# Patient Record
Sex: Female | Born: 2000 | Race: Black or African American | Hispanic: No | Marital: Single | Smoking: Never smoker
Health system: Southern US, Community
[De-identification: ages and names within clinical notes are randomized; demographics above are authoritative.]

## PROBLEM LIST (undated history)

## (undated) DIAGNOSIS — J45909 Unspecified asthma, uncomplicated: Secondary | ICD-10-CM

## (undated) DIAGNOSIS — E785 Hyperlipidemia, unspecified: Secondary | ICD-10-CM

## (undated) DIAGNOSIS — M419 Scoliosis, unspecified: Secondary | ICD-10-CM

## (undated) DIAGNOSIS — E119 Type 2 diabetes mellitus without complications: Secondary | ICD-10-CM

## (undated) DIAGNOSIS — I10 Essential (primary) hypertension: Secondary | ICD-10-CM

## (undated) HISTORY — DX: Scoliosis, unspecified: M41.9

## (undated) HISTORY — DX: Unspecified asthma, uncomplicated: J45.909

## (undated) HISTORY — DX: Hyperlipidemia, unspecified: E78.5

## (undated) HISTORY — DX: Type 2 diabetes mellitus without complications: E11.9

## (undated) HISTORY — DX: Essential (primary) hypertension: I10

---
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
## 2020-01-22 ENCOUNTER — Encounter: Payer: Medicaid Other | Admitting: Certified Nurse Midwife

## 2020-01-24 ENCOUNTER — Telehealth: Payer: Self-pay

## 2020-01-24 NOTE — Telephone Encounter (Signed)
Sent pt no show letter

## 2020-02-24 ENCOUNTER — Ambulatory Visit: Payer: Medicaid Other

## 2020-03-31 DIAGNOSIS — Z0289 Encounter for other administrative examinations: Secondary | ICD-10-CM

## 2020-06-08 ENCOUNTER — Ambulatory Visit: Payer: Medicaid Other

## 2020-06-30 ENCOUNTER — Ambulatory Visit (LOCAL_COMMUNITY_HEALTH_CENTER): Payer: Medicaid Other | Admitting: Advanced Practice Midwife

## 2020-06-30 ENCOUNTER — Encounter: Payer: Self-pay | Admitting: Advanced Practice Midwife

## 2020-06-30 ENCOUNTER — Other Ambulatory Visit: Payer: Self-pay

## 2020-06-30 VITALS — BP 129/79 | Ht 61.0 in

## 2020-06-30 DIAGNOSIS — I159 Secondary hypertension, unspecified: Secondary | ICD-10-CM

## 2020-06-30 DIAGNOSIS — Z3202 Encounter for pregnancy test, result negative: Secondary | ICD-10-CM | POA: Diagnosis not present

## 2020-06-30 DIAGNOSIS — E1165 Type 2 diabetes mellitus with hyperglycemia: Secondary | ICD-10-CM | POA: Insufficient documentation

## 2020-06-30 DIAGNOSIS — E119 Type 2 diabetes mellitus without complications: Secondary | ICD-10-CM | POA: Insufficient documentation

## 2020-06-30 DIAGNOSIS — F419 Anxiety disorder, unspecified: Secondary | ICD-10-CM

## 2020-06-30 DIAGNOSIS — Z3009 Encounter for other general counseling and advice on contraception: Secondary | ICD-10-CM

## 2020-06-30 DIAGNOSIS — Z7289 Other problems related to lifestyle: Secondary | ICD-10-CM | POA: Insufficient documentation

## 2020-06-30 DIAGNOSIS — E0869 Diabetes mellitus due to underlying condition with other specified complication: Secondary | ICD-10-CM

## 2020-06-30 DIAGNOSIS — Z30017 Encounter for initial prescription of implantable subdermal contraceptive: Secondary | ICD-10-CM | POA: Diagnosis not present

## 2020-06-30 DIAGNOSIS — F319 Bipolar disorder, unspecified: Secondary | ICD-10-CM | POA: Insufficient documentation

## 2020-06-30 DIAGNOSIS — I10 Essential (primary) hypertension: Secondary | ICD-10-CM | POA: Insufficient documentation

## 2020-06-30 HISTORY — DX: Other problems related to lifestyle: Z72.89

## 2020-06-30 LAB — WET PREP FOR TRICH, YEAST, CLUE
Trichomonas Exam: NEGATIVE
Yeast Exam: NEGATIVE

## 2020-06-30 LAB — PREGNANCY, URINE: Preg Test, Ur: NEGATIVE

## 2020-06-30 MED ORDER — ETONOGESTREL 68 MG ~~LOC~~ IMPL
68.0000 mg | DRUG_IMPLANT | Freq: Once | SUBCUTANEOUS | Status: AC
  Administered 2020-06-30: 68 mg via SUBCUTANEOUS

## 2020-06-30 NOTE — Progress Notes (Signed)
[facility] DEPARTMENT [facility] [address] Main Number: [phone]    Family Planning Visit- Initial Visit  Subjective:  Tina Day is a 20 y.o. SBF nonsmoker [id number]   being seen today for an initial annual visit and to discuss contraceptive options.  The patient is currently using Condoms for pregnancy prevention. Patient reports she does not want a pregnancy in the next year.  Patient has the following medical conditions has Seasonal allergies; Scoliosis; Asthma; Irregular menses; Obesity (BMI 30-39.9) 195 lbs; Psychophysiological insomnia; Diabetes (HCC); Self-mutilation age 52-2017; and Hypertension dx'd 2021--noncompliant with meds on their problem list.  No chief complaint on file.   Patient reports LMP 2021. Was on DMPA in 2021. PT states she lost her virginity on 05/23/20 with partner of 1-2 mo with condom; 1 partner in last 3 mo. Wants Nexplanon for birth control. Not working. 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 Living with her mom and brother. Not taking meds for HTN dx'd 2021.  Patient denies cigs, vaping, cigars, MJ, ETOH  There is no height or weight on file to calculate BMI. - Patient is eligible for diabetes screening based on BMI and age >40?  Pt has diabetes HA1C ordered? Followed by primary care MD  Patient reports 1  partner/s in last year. Desires STI screening?  Yes  Has patient been screened once for HCV in the past?  No  No results found for: HCVAB  Does the patient have current drug use (including MJ), have a partner with drug use, and/or has been incarcerated since last result? No  If yes-- Screen for HCV through [facility]   Does the patient meet criteria for HBV testing? No  Criteria:  -Household, sexual or needle sharing contact with HBV -History of drug use -HIV positive -Those with known Hep C   Health Maintenance Due  Topic Date Due   PNEUMOCOCCAL POLYSACCHARIDE VACCINE AGE  77-64 HIGH RISK  Never done   COVID-19 Vaccine (1) Never done   Pneumococcal Vaccine 74-7 Years old (1 - PCV) Never done   FOOT EXAM  Never done   OPHTHALMOLOGY EXAM  Never done   HPV VACCINES (1 - 2-dose series) Never done   HEMOGLOBIN A1C  12/05/2013   CHLAMYDIA SCREENING  Never done   HIV Screening  Never done   Hepatitis C Screening  Never done   TETANUS/TDAP  Never done   Zoster Vaccines- Shingrix (1 of 2) Never done    Review of Systems  All other systems reviewed and are negative.  The following portions of the patient's history were reviewed and updated as appropriate: allergies, current medications, past family history, past medical history, past social history, past surgical history and problem list. Problem list updated.   See flowsheet for other program required questions.  Objective:  There were no vitals filed for this visit.  Physical Exam Constitutional:      Appearance: Normal appearance. She is obese.  HENT:     Head: Normocephalic and atraumatic.     Mouth/Throat:     Mouth: Mucous membranes are moist.     Comments: Pt states last dental exam 05/2020 Eyes:     Conjunctiva/sclera: Conjunctivae normal.  Neck:     Thyroid: No thyroid mass, thyromegaly or thyroid tenderness.  Cardiovascular:     Rate and Rhythm: Normal rate and regular rhythm.  Pulmonary:     Effort: Pulmonary effort is normal.     Breath  sounds: Normal breath sounds.  Abdominal:     Palpations: Abdomen is soft.     Tenderness: There is no guarding.     Comments: Soft without masses or tenderness, poor tone  Genitourinary:    General: Normal vulva.     Exam position: Lithotomy position.     Vagina: Vaginal discharge (clear leukorrhea, ph<4.5) present.     Rectum: Normal.  Musculoskeletal:        General: Normal range of motion.     Cervical back: Normal range of motion and neck supple.  Skin:    General: Skin is warm and dry.  Neurological:     Mental Status: She is alert.   Psychiatric:        Mood and Affect: Mood normal.      Assessment and Plan:  Tina Colcord is a 20 y.o. female presenting to the [facility] Department for an initial annual wellness/contraceptive visit  Contraception counseling: Reviewed all forms of birth control options in the tiered based approach. available including abstinence; over the counter/barrier methods; hormonal contraceptive medication including pill, patch, ring, injection,contraceptive implant, ECP; hormonal and nonhormonal IUDs; permanent sterilization options including vasectomy and the various tubal sterilization modalities. Risks, benefits, and typical effectiveness rates were reviewed.  Questions were answered.  Written information was also given to the patient to review.  Patient desires nexplanon insertion, this was prescribed for patient. She will follow up in  prn for surveillance.  She was told to call with any further questions, or with any concerns about this method of contraception.  Emphasized use of condoms 100% of the time for STI prevention.  Patient was offered ECP.  ECP was not accepted by the patient. ECP counseling was not given - see RN documentation  1. Family planning Treat wet mount per standing orders Immunization nurse consult - WET PREP FOR TRICH, YEAST, CLUE - Pregnancy, urine - Chlamydia/Gonorrhea [facility]  2. Encounter for initial prescription of implantable subdermal contraceptive Nexplanon Insertion Procedure Patient identified, informed consent performed, consent signed.   Patient does understand that irregular bleeding is a very common side effect of this medication. She was advised to have backup contraception after placement. Patient was determined to meet WHO criteria for not being pregnant. Appropriate time out taken.  The insertion site was identified 8-10 cm (3-4 inches) from the medial epicondyle of the humerus and 3-5 cm (1.25-2 inches) posterior to (below) the sulcus  (groove) between the biceps and triceps muscles of the patient's left arm and marked.  Patient was prepped with alcohol swab and then injected with 3 ml of 1% lidocaine.  Arm was prepped with chlorhexidene, Nexplanon removed from packaging,  Device confirmed in needle, then inserted full length of needle and withdrawn per handbook instructions. Nexplanon was able to palpated in the patient's arm; patient palpated the insert herself. There was minimal blood loss.  Patient insertion site covered with guaze and a pressure bandage to reduce any bruising.  The patient tolerated the procedure well and was given post procedure instructions.   Nexplanon:   Counseled patient to take OTC analgesic starting as soon as lidocaine starts to wear off and take regularly for at least 48 hr to decrease discomfort.  Specifically to take with food or milk to decrease stomach upset and for IB 600 mg (3 tablets) every 6 hrs; IB 800 mg (4 tablets) every 8 hrs; or Aleve 2 tablets every 12 hrs.   3. Diabetes mellitus due to underlying condition  with other specified complication, unspecified whether long term insulin use (HCC)   4. Self-mutilation age 29-2017   5. Secondary hypertension Non compliant with medication     No follow-ups on file.  No future appointments.  Alberteen Spindle, CNM

## 2020-06-30 NOTE — Progress Notes (Signed)
Pt here for PE and Nexplanon insertion.  Contraindications reviewed and consent forms signed.  Nexplanon inserted without any complications.  Wet mount results reviewed, no medication required.  Pt informed to call with any questions or concerns. Berdie Ogren, RN

## 2020-06-30 NOTE — Progress Notes (Signed)
Please see other visit for notes on this same day

## 2020-09-18 DIAGNOSIS — D509 Iron deficiency anemia, unspecified: Secondary | ICD-10-CM | POA: Insufficient documentation

## 2020-09-18 NOTE — Progress Notes (Deleted)
Tina Day  Telephone:(336) [phone] Fax:(336) [phone]  ID: Tina Day OB: May 25, 2000  MR#: [id number]  JAS#:[id number]  Patient Care Team: Pcp, No as PCP - General  CHIEF COMPLAINT: Iron deficiency anemia.  INTERVAL HISTORY: ***  REVIEW OF SYSTEMS:   ROS  As per HPI. Otherwise, a complete review of systems is negative.  PAST MEDICAL HISTORY: Past Medical History:  Diagnosis Date   Asthma    Diabetes (HCC)    Hyperlipidemia    Hypertension    Scoliosis     PAST SURGICAL HISTORY: No past surgical history on file.  FAMILY HISTORY: Family History  Problem Relation Age of Onset   Seizures Paternal Uncle    Diabetes Maternal Grandmother    Stroke Maternal Grandmother    Diabetes Maternal Grandfather    Cancer Paternal Grandmother    Stroke Paternal Grandmother    Cancer Paternal Grandfather    Thyroid disease Mother    Lupus Mother     ADVANCED DIRECTIVES (Y/N):  N  HEALTH MAINTENANCE: Social History   Tobacco Use   Smoking status: Never   Smokeless tobacco: Never  Vaping Use   Vaping Use: Never used  Substance Use Topics   Alcohol use: Never   Drug use: Never     Colonoscopy:  PAP:  Bone density:  Lipid panel:  Allergies  Allergen Reactions   Penicillins Swelling    Current Outpatient Medications  Medication Sig Dispense Refill   Acetaminophen (TYLENOL PO) Take by mouth.     adapalene (DIFFERIN) 0.1 % cream Apply pea sized amount to entire face, start 2-3 times per week. Then increase as tolerated. DISPENSE BRAND PER MEDICAID     Cholecalciferol (VITAMIN D3) 1.25 MG (50000 UT) CAPS Take 1 capsule by mouth once a week.     empagliflozin (JARDIANCE) 10 MG TABS tablet Take by mouth daily.     losartan (COZAAR) 25 MG tablet Take 12.5 mg by mouth daily.     medroxyPROGESTERone (DEPO-PROVERA) 150 MG/ML injection Inject 1 mL (150 mg total) into the muscle every 3 (three) months. (Patient not taking: Reported on 06/30/2020) 1 mL 3    metFORMIN (GLUCOPHAGE) 500 MG tablet Take 500 mg by mouth 2 (two) times daily. (Patient not taking: Reported on 06/30/2020)     pravastatin (PRAVACHOL) 10 MG tablet SMARTSIG:1 Tablet(s) By Mouth Every Evening     spironolactone (ALDACTONE) 100 MG tablet Take by mouth.     No current facility-administered medications for this visit.    OBJECTIVE: There were no vitals filed for this visit.   There is no height or weight on file to calculate BMI.    ECOG FS:{CHL ONC [id number]  General: Well-developed, well-nourished, no acute distress. Eyes: Pink conjunctiva, anicteric sclera. HEENT: Normocephalic, moist mucous membranes. Lungs: No audible wheezing or coughing. Heart: Regular rate and rhythm. Abdomen: Soft, nontender, no obvious distention. Musculoskeletal: No edema, cyanosis, or clubbing. Neuro: Alert, answering all questions appropriately. Cranial nerves grossly intact. Skin: No rashes or petechiae noted. Psych: Normal affect. Lymphatics: No cervical, calvicular, axillary or inguinal LAD.   LAB RESULTS:  Lab Results  Component Value Date   NA 137 06/04/2013   K 3.8 06/04/2013   CL 103 06/04/2013   CO2 31 (H) 06/04/2013   GLUCOSE 86 06/04/2013   BUN 8 06/04/2013   CREATININE 0.62 06/04/2013   CALCIUM 8.9 (L) 06/04/2013   PROT 8.2 06/04/2013   ALBUMIN 3.5 (L) 06/04/2013   AST 21 06/04/2013   ALT 18 06/04/2013  ALKPHOS 138 (H) 06/04/2013   BILITOT 0.3 06/04/2013    Lab Results  Component Value Date   WBC 4.4 06/04/2013   NEUTROABS 2.2 06/04/2013   HGB 13.6 06/04/2013   HCT 41.8 06/04/2013   MCV 82 06/04/2013   PLT 384 06/04/2013     STUDIES: No results found.  ASSESSMENT: Iron deficiency anemia.  PLAN:    Iron deficiency anemia:  Patient expressed understanding and was in agreement with this plan. She also understands that She can call clinic at any time with any questions, concerns, or complaints.     Jeralyn Ruths, MD   09/18/2020 12:51  PM

## 2020-09-24 ENCOUNTER — Inpatient Hospital Stay: Payer: Medicaid Other | Admitting: Oncology

## 2020-09-24 ENCOUNTER — Encounter (INDEPENDENT_AMBULATORY_CARE_PROVIDER_SITE_OTHER): Payer: Self-pay

## 2020-09-24 ENCOUNTER — Inpatient Hospital Stay: Payer: Medicaid Other

## 2020-09-24 DIAGNOSIS — D509 Iron deficiency anemia, unspecified: Secondary | ICD-10-CM

## 2020-10-04 NOTE — Progress Notes (Signed)
[facility]  Telephone:(336) [phone] Fax:(336) [phone]  ID: Junie Bame OB: 2000-07-16  MR#: [id number]  NUU#:[id number]  Patient Care Team: Armando Gang, FNP as PCP - General (Family Medicine)  CHIEF COMPLAINT: Iron deficiency anemia.  INTERVAL HISTORY: Patient is a 20 year old female who was noted to have declining hemoglobin and iron stores on routine blood work likely secondary to heavy menses.  She currently feels well and is asymptomatic.  She does not complain of any weakness or fatigue.  She has a good appetite and denies weight loss.  She has no neurologic complaints.  She denies any recent fevers or illnesses.  She has no chest pain, shortness of breath, cough, or hemoptysis.  She denies any nausea, vomiting, constipation, or diarrhea.  She has no urinary complaints.  Patient feels at her baseline and offers no specific complaints today.  REVIEW OF SYSTEMS:   Review of Systems  Constitutional: Negative.  Negative for fever, malaise/fatigue and weight loss.  Respiratory: Negative.  Negative for cough, hemoptysis and shortness of breath.   Cardiovascular: Negative.  Negative for chest pain and leg swelling.  Gastrointestinal: Negative.  Negative for abdominal pain, blood in stool and melena.  Genitourinary: Negative.  Negative for hematuria.  Musculoskeletal: Negative.  Negative for back pain.  Skin: Negative.  Negative for rash.  Neurological: Negative.  Negative for dizziness, focal weakness, weakness and headaches.  Psychiatric/Behavioral: Negative.  The patient is not nervous/anxious.    As per HPI. Otherwise, a complete review of systems is negative.  PAST MEDICAL HISTORY: Past Medical History:  Diagnosis Date   Asthma    Diabetes (HCC)    Hyperlipidemia    Hypertension    Scoliosis     PAST SURGICAL HISTORY: No past surgical history on file.  FAMILY HISTORY: Family History  Problem Relation Age of Onset   Seizures Paternal Uncle     Diabetes Maternal Grandmother    Stroke Maternal Grandmother    Diabetes Maternal Grandfather    Cancer Paternal Grandmother    Stroke Paternal Grandmother    Cancer Paternal Grandfather    Thyroid disease Mother    Lupus Mother     ADVANCED DIRECTIVES (Y/N):  N  HEALTH MAINTENANCE: Social History   Tobacco Use   Smoking status: Never   Smokeless tobacco: Never  Vaping Use   Vaping Use: Never used  Substance Use Topics   Alcohol use: Never   Drug use: Never     Colonoscopy:  PAP:  Bone density:  Lipid panel:  Allergies  Allergen Reactions   Penicillins Swelling    Current Outpatient Medications  Medication Sig Dispense Refill   Acetaminophen (TYLENOL PO) Take by mouth.     adapalene (DIFFERIN) 0.1 % cream Apply pea sized amount to entire face, start 2-3 times per week. Then increase as tolerated. DISPENSE BRAND PER MEDICAID     Cholecalciferol (VITAMIN D3) 1.25 MG (50000 UT) CAPS Take 1 capsule by mouth once a week.     empagliflozin (JARDIANCE) 10 MG TABS tablet Take by mouth daily.     losartan (COZAAR) 25 MG tablet Take 12.5 mg by mouth daily.     pravastatin (PRAVACHOL) 10 MG tablet SMARTSIG:1 Tablet(s) By Mouth Every Evening     spironolactone (ALDACTONE) 100 MG tablet Take by mouth.     medroxyPROGESTERone (DEPO-PROVERA) 150 MG/ML injection Inject 1 mL (150 mg total) into the muscle every 3 (three) months. (Patient not taking: No sig reported) 1 mL 3   metFORMIN (  GLUCOPHAGE) 500 MG tablet Take 500 mg by mouth 2 (two) times daily. (Patient not taking: No sig reported)     No current facility-administered medications for this visit.    OBJECTIVE: Vitals:   10/06/20 1503  BP: 122/75  Pulse: 84  Resp: 16  Temp: 97.9 F (36.6 C)  SpO2: 99%     Body mass index is 31.33 kg/m.    ECOG FS:0 - Asymptomatic  General: Well-developed, well-nourished, no acute distress. Eyes: Pink conjunctiva, anicteric sclera. HEENT: Normocephalic, moist mucous  membranes. Lungs: No audible wheezing or coughing. Heart: Regular rate and rhythm. Abdomen: Soft, nontender, no obvious distention. Musculoskeletal: No edema, cyanosis, or clubbing. Neuro: Alert, answering all questions appropriately. Cranial nerves grossly intact. Skin: No rashes or petechiae noted. Psych: Normal affect. Lymphatics: No cervical, calvicular, axillary or inguinal LAD.   LAB RESULTS:  Lab Results  Component Value Date   NA 137 06/04/2013   K 3.8 06/04/2013   CL 103 06/04/2013   CO2 31 (H) 06/04/2013   GLUCOSE 86 06/04/2013   BUN 8 06/04/2013   CREATININE 0.62 06/04/2013   CALCIUM 8.9 (L) 06/04/2013   PROT 8.2 06/04/2013   ALBUMIN 3.5 (L) 06/04/2013   AST 21 06/04/2013   ALT 18 06/04/2013   ALKPHOS 138 (H) 06/04/2013   BILITOT 0.3 06/04/2013    Lab Results  Component Value Date   WBC 7.6 10/06/2020   NEUTROABS 2.2 06/04/2013   HGB 13.4 10/06/2020   HCT 41.9 10/06/2020   MCV 72.6 (L) 10/06/2020   PLT 476 (H) 10/06/2020   Lab Results  Component Value Date   IRON 82 10/06/2020   TIBC 405 10/06/2020   IRONPCTSAT 20 10/06/2020   Lab Results  Component Value Date   FERRITIN 14 10/06/2020     STUDIES: No results found.  ASSESSMENT: Iron deficiency anemia.  PLAN:    Iron deficiency anemia: Resolved.  Patient's hemoglobin and iron stores are now within normal limits.  B12 and folate are also within normal limits.  No intervention is needed at this time.  Patient does not require IV iron.  Return to clinic in 4 months with repeat laboratory work and further evaluation.  If her hemoglobin and iron stores continue to remain within normal limits, she likely can be discharged from clinic. Heavy menses: Resolved with implant.  Continue follow-up with OB/GYN as indicated. Thrombocytosis: Mild, monitor. Microcytosis: Unrelated to iron deficiency.  Will order hemoglobin electrophoresis with next lab draw.  Patient expressed understanding and was in  agreement with this plan. She also understands that She can call clinic at any time with any questions, concerns, or complaints.     Jeralyn Ruths, MD   10/09/2020 6:24 AM

## 2020-10-06 ENCOUNTER — Inpatient Hospital Stay: Payer: Medicaid Other

## 2020-10-06 ENCOUNTER — Encounter (INDEPENDENT_AMBULATORY_CARE_PROVIDER_SITE_OTHER): Payer: Self-pay

## 2020-10-06 ENCOUNTER — Inpatient Hospital Stay: Payer: Medicaid Other | Attending: Oncology | Admitting: Oncology

## 2020-10-06 VITALS — BP 122/75 | HR 84 | Temp 97.9°F | Resp 16 | Wt 165.8 lb

## 2020-10-06 DIAGNOSIS — Z79899 Other long term (current) drug therapy: Secondary | ICD-10-CM | POA: Insufficient documentation

## 2020-10-06 DIAGNOSIS — D75839 Thrombocytosis, unspecified: Secondary | ICD-10-CM | POA: Insufficient documentation

## 2020-10-06 DIAGNOSIS — D509 Iron deficiency anemia, unspecified: Secondary | ICD-10-CM | POA: Diagnosis present

## 2020-10-06 DIAGNOSIS — R718 Other abnormality of red blood cells: Secondary | ICD-10-CM | POA: Insufficient documentation

## 2020-10-06 DIAGNOSIS — N920 Excessive and frequent menstruation with regular cycle: Secondary | ICD-10-CM | POA: Diagnosis not present

## 2020-10-06 LAB — VITAMIN B12: Vitamin B-12: 411 pg/mL (ref 180–914)

## 2020-10-06 LAB — CBC
HCT: 41.9 % (ref 36.0–46.0)
Hemoglobin: 13.4 g/dL (ref 12.0–15.0)
MCH: 23.2 pg — ABNORMAL LOW (ref 26.0–34.0)
MCHC: 32 g/dL (ref 30.0–36.0)
MCV: 72.6 fL — ABNORMAL LOW (ref 80.0–100.0)
Platelets: 476 10*3/uL — ABNORMAL HIGH (ref 150–400)
RBC: 5.77 MIL/uL — ABNORMAL HIGH (ref 3.87–5.11)
RDW: 16.6 % — ABNORMAL HIGH (ref 11.5–15.5)
WBC: 7.6 10*3/uL (ref 4.0–10.5)
nRBC: 0 % (ref 0.0–0.2)

## 2020-10-06 LAB — FERRITIN: Ferritin: 14 ng/mL (ref 11–307)

## 2020-10-06 LAB — IRON AND TIBC
Iron: 82 ug/dL (ref 28–170)
Saturation Ratios: 20 % (ref 10.4–31.8)
TIBC: 405 ug/dL (ref 250–450)
UIBC: 323 ug/dL

## 2020-10-06 LAB — FOLATE: Folate: 15.3 ng/mL (ref >5.9)

## 2020-10-06 NOTE — Progress Notes (Signed)
Pt is new pt here for referral regarding anemia. No complaints/concerns at this time.

## 2020-10-09 ENCOUNTER — Other Ambulatory Visit: Payer: Self-pay | Admitting: Oncology

## 2020-10-09 ENCOUNTER — Encounter: Payer: Self-pay | Admitting: Emergency Medicine

## 2020-10-12 ENCOUNTER — Inpatient Hospital Stay: Payer: Medicaid Other

## 2020-10-15 ENCOUNTER — Ambulatory Visit: Payer: Medicaid Other

## 2020-10-20 ENCOUNTER — Ambulatory Visit: Payer: Medicaid Other

## 2020-10-23 ENCOUNTER — Ambulatory Visit: Payer: Medicaid Other

## 2020-10-26 ENCOUNTER — Ambulatory Visit: Payer: Medicaid Other

## 2021-01-28 ENCOUNTER — Other Ambulatory Visit: Payer: Self-pay | Admitting: *Deleted

## 2021-01-28 DIAGNOSIS — D509 Iron deficiency anemia, unspecified: Secondary | ICD-10-CM

## 2021-02-02 ENCOUNTER — Ambulatory Visit: Payer: Medicaid Other | Admitting: Podiatry

## 2021-02-02 ENCOUNTER — Other Ambulatory Visit: Payer: Self-pay

## 2021-02-02 DIAGNOSIS — Q666 Other congenital valgus deformities of feet: Secondary | ICD-10-CM | POA: Diagnosis not present

## 2021-02-02 NOTE — Progress Notes (Signed)
Subjective:  Patient ID: Tina Day, female    DOB: 09/13/2000,  MRN: [id number]  Chief Complaint  Patient presents with   Foot Orthotics    21 y.o. female presents with the above complaint.  Patient presents with complaint bilateral arch and heel pain.  Patient states that she is 5 foot history of same in the past with her fasciitis.  She states that it started to hurt again.  She had orthotics made but they were not functioning right and they were too thick.  She wanted know if she can get another pair of orthotics.  She denies any other acute complaint she has not seen anyone else prior to seeing me for this.   Review of Systems: Negative except as noted in the HPI. Denies N/V/F/Ch.  Past Medical History:  Diagnosis Date   Asthma    Diabetes ([place])    Hyperlipidemia    Hypertension    Scoliosis     Current Outpatient Medications:    Acetaminophen (TYLENOL PO), Take by mouth., Disp: , Rfl:    adapalene (DIFFERIN) 0.1 % cream, Apply pea sized amount to entire face, start 2-3 times per week. Then increase as tolerated. DISPENSE BRAND PER MEDICAID, Disp: , Rfl:    Cholecalciferol (VITAMIN D3) 1.25 MG (50000 UT) CAPS, Take 1 capsule by mouth once a week., Disp: , Rfl:    empagliflozin (JARDIANCE) 10 MG TABS tablet, Take by mouth daily., Disp: , Rfl:    losartan (COZAAR) 25 MG tablet, Take 12.5 mg by mouth daily., Disp: , Rfl:    medroxyPROGESTERone (DEPO-PROVERA) 150 MG/ML injection, Inject 1 mL (150 mg total) into the muscle every 3 (three) months. (Patient not taking: No sig reported), Disp: 1 mL, Rfl: 3   metFORMIN (GLUCOPHAGE) 500 MG tablet, Take 500 mg by mouth 2 (two) times daily. (Patient not taking: No sig reported), Disp: , Rfl:    pravastatin (PRAVACHOL) 10 MG tablet, SMARTSIG:1 Tablet(s) By Mouth Every Evening, Disp: , Rfl:    spironolactone (ALDACTONE) 100 MG tablet, Take by mouth., Disp: , Rfl:   Social History   Tobacco Use  Smoking Status Never  Smokeless Tobacco  Never    Allergies  Allergen Reactions   Penicillins Swelling   Objective:  There were no vitals filed for this visit. There is no height or weight on file to calculate BMI. Constitutional Well developed. Well nourished.  Vascular Dorsalis pedis pulses palpable bilaterally. Posterior tibial pulses palpable bilaterally. Capillary refill normal to all digits.  No cyanosis or clubbing noted. Pedal hair growth normal.  Neurologic Normal speech. Oriented to person, place, and time. Epicritic sensation to light touch grossly present bilaterally.  Dermatologic Nails well groomed and normal in appearance. No open wounds. No skin lesions.  Orthopedic: Gait examination shows pes planovalgus with calcaneovalgus to many toe signs partially able to recruit the arch with dorsiflexion of the hallux.  Semiflexible in nature.  Able to perform single and double heel raise with return of calcaneus to neutral position   Radiographs: None Assessment:   1. Pes planovalgus    Plan:  Patient was evaluated and treated and all questions answered.  Pes planovalgus -I explained to patient the etiology of pes planovalgus and relationship with Planter fasciitis and various treatment options were discussed.  Given patient foot structure in the setting of Planter fasciitis I believe patient will benefit from custom-made orthotics to help control the hindfoot motion support the arch of the foot and take the stress away from plantar  fascial.  Patient agrees with the plan like to proceed with orthotics -Patient would like to think about the financial consideration of orthotics and will get back to me when she is ready.   No follow-ups on file.

## 2021-02-04 ENCOUNTER — Other Ambulatory Visit: Payer: Self-pay

## 2021-02-04 ENCOUNTER — Inpatient Hospital Stay: Payer: Medicaid Other | Attending: Nurse Practitioner

## 2021-02-04 DIAGNOSIS — D509 Iron deficiency anemia, unspecified: Secondary | ICD-10-CM | POA: Insufficient documentation

## 2021-02-04 DIAGNOSIS — D75839 Thrombocytosis, unspecified: Secondary | ICD-10-CM | POA: Insufficient documentation

## 2021-02-04 LAB — CBC WITH DIFFERENTIAL/PLATELET
Abs Immature Granulocytes: 0.01 10*3/uL (ref 0.00–0.07)
Basophils Absolute: 0 10*3/uL (ref 0.0–0.1)
Basophils Relative: 1 %
Eosinophils Absolute: 0.1 10*3/uL (ref 0.0–0.5)
Eosinophils Relative: 1 %
HCT: 41.5 % (ref 36.0–46.0)
Hemoglobin: 13.3 g/dL (ref 12.0–15.0)
Immature Granulocytes: 0 %
Lymphocytes Relative: 48 %
Lymphs Abs: 3.1 10*3/uL (ref 0.7–4.0)
MCH: 24.4 pg — ABNORMAL LOW (ref 26.0–34.0)
MCHC: 32 g/dL (ref 30.0–36.0)
MCV: 76 fL — ABNORMAL LOW (ref 80.0–100.0)
Monocytes Absolute: 0.4 10*3/uL (ref 0.1–1.0)
Monocytes Relative: 5 %
Neutro Abs: 2.9 10*3/uL (ref 1.7–7.7)
Neutrophils Relative %: 45 %
Platelets: 466 10*3/uL — ABNORMAL HIGH (ref 150–400)
RBC: 5.46 MIL/uL — ABNORMAL HIGH (ref 3.87–5.11)
RDW: 15.9 % — ABNORMAL HIGH (ref 11.5–15.5)
WBC: 6.5 10*3/uL (ref 4.0–10.5)
nRBC: 0 % (ref 0.0–0.2)

## 2021-02-04 LAB — IRON AND TIBC
Iron: 41 ug/dL (ref 28–170)
Saturation Ratios: 12 % (ref 10.4–31.8)
TIBC: 357 ug/dL (ref 250–450)
UIBC: 316 ug/dL

## 2021-02-04 LAB — FERRITIN: Ferritin: 12 ng/mL (ref 11–307)

## 2021-02-08 ENCOUNTER — Other Ambulatory Visit: Payer: Self-pay

## 2021-02-08 ENCOUNTER — Inpatient Hospital Stay: Payer: Medicaid Other

## 2021-02-08 ENCOUNTER — Inpatient Hospital Stay (HOSPITAL_BASED_OUTPATIENT_CLINIC_OR_DEPARTMENT_OTHER): Payer: Medicaid Other | Admitting: Nurse Practitioner

## 2021-02-08 VITALS — BP 114/73 | HR 85 | Temp 96.2°F | Resp 17 | Wt 170.0 lb

## 2021-02-08 DIAGNOSIS — R718 Other abnormality of red blood cells: Secondary | ICD-10-CM

## 2021-02-08 DIAGNOSIS — D509 Iron deficiency anemia, unspecified: Secondary | ICD-10-CM | POA: Diagnosis not present

## 2021-02-08 LAB — HGB FRACTIONATION CASCADE
Hgb A2: 3 % (ref 1.8–3.2)
Hgb A: 97 % (ref 96.4–98.8)
Hgb F: 0 % (ref 0.0–2.0)
Hgb S: 0 %

## 2021-02-08 NOTE — Progress Notes (Signed)
Tina Day  [phone] [phone]  ID: Tina Day OB: 23-Oct-2000  MR#: [id number]  [id number]  Patient Care Team: Remi Haggard, FNP as PCP - General (Family Medicine)  CHIEF COMPLAINT: Iron deficiency anemia.  INTERVAL HISTORY: Patient is a 21 year old female who returns to clinic for history of iron deficiency.  She continues to have heavy menstrual periods but denies specific complaints or concerns at this time. She does not complain of any weakness or fatigue.  She has a good appetite and denies weight loss.  She has no neurologic complaints.  She denies any recent fevers or illnesses.  She has no chest pain, shortness of breath, cough, or hemoptysis.  She denies any nausea, vomiting, constipation, or diarrhea.  She has no urinary complaints.  Patient feels at her baseline and offers no specific complaints today.  REVIEW OF SYSTEMS:   Review of Systems  Constitutional: Negative.  Negative for fever, malaise/fatigue and weight loss.  Respiratory: Negative.  Negative for cough, hemoptysis and shortness of breath.   Cardiovascular: Negative.  Negative for chest pain and leg swelling.  Gastrointestinal: Negative.  Negative for abdominal pain, blood in stool and melena.  Genitourinary: Negative.  Negative for hematuria.  Musculoskeletal: Negative.  Negative for back pain.  Skin: Negative.  Negative for rash.  Neurological: Negative.  Negative for dizziness, focal weakness, weakness and headaches.  Psychiatric/Behavioral: Negative.  The patient is not nervous/anxious.    As per HPI. Otherwise, a complete review of systems is negative.  PAST MEDICAL HISTORY: Past Medical History:  Diagnosis Date   Asthma    Diabetes ([place])    Hyperlipidemia    Hypertension    Scoliosis     PAST SURGICAL HISTORY: No past surgical history on file.  FAMILY HISTORY: Family History  Problem Relation Age of Onset   Seizures Paternal Uncle     Diabetes Maternal Grandmother    Stroke Maternal Grandmother    Diabetes Maternal Grandfather    Cancer Paternal Grandmother    Stroke Paternal Grandmother    Cancer Paternal Grandfather    Thyroid disease Mother    Lupus Mother     ADVANCED DIRECTIVES (Y/N):  N  HEALTH MAINTENANCE: Social History   Tobacco Use   Smoking status: Never   Smokeless tobacco: Never  Vaping Use   Vaping Use: Never used  Substance Use Topics   Alcohol use: Never   Drug use: Never     Colonoscopy:  PAP:  Bone density:  Lipid panel:  Allergies  Allergen Reactions   Penicillins Swelling    Current Outpatient Medications  Medication Sig Dispense Refill   Acetaminophen (TYLENOL PO) Take by mouth.     adapalene (DIFFERIN) 0.1 % cream Apply pea sized amount to entire face, start 2-3 times per week. Then increase as tolerated. DISPENSE BRAND PER MEDICAID     Cholecalciferol (VITAMIN D3) 1.25 MG (50000 UT) CAPS Take 1 capsule by mouth once a week.     empagliflozin (JARDIANCE) 10 MG TABS tablet Take by mouth daily.     losartan (COZAAR) 25 MG tablet Take 12.5 mg by mouth daily.     pravastatin (PRAVACHOL) 10 MG tablet SMARTSIG:1 Tablet(s) By Mouth Every Evening     spironolactone (ALDACTONE) 100 MG tablet Take by mouth.     medroxyPROGESTERone (DEPO-PROVERA) 150 MG/ML injection Inject 1 mL (150 mg total) into the muscle every 3 (three) months. (Patient not taking: Reported on 06/30/2020) 1 mL 3   metFORMIN (GLUCOPHAGE) 500  MG tablet Take 500 mg by mouth 2 (two) times daily. (Patient not taking: Reported on 06/30/2020)     No current facility-administered medications for this visit.    OBJECTIVE: Vitals:   02/08/21 1309  BP: 114/73  Pulse: 85  Resp: 17  Temp: (!) 96.2 F (35.7 C)  SpO2: 100%     Body mass index is 32.12 kg/m.    ECOG FS:0 - Asymptomatic  General: Well-developed, well-nourished, no acute distress. Eyes: Pink conjunctiva, anicteric sclera. Lungs: Clear to auscultation  bilaterally.  No audible wheezing or coughing Heart: Regular rate and rhythm.  Abdomen: Soft, nontender, nondistended.  Musculoskeletal: No edema, cyanosis, or clubbing. Neuro: Alert, answering all questions appropriately. Cranial nerves grossly intact. Skin: No rashes or petechiae noted. Psych: Normal affect.   LAB RESULTS:  Lab Results  Component Value Date   NA 137 06/04/2013   K 3.8 06/04/2013   CL 103 06/04/2013   CO2 31 (H) 06/04/2013   GLUCOSE 86 06/04/2013   BUN 8 06/04/2013   CREATININE 0.62 06/04/2013   CALCIUM 8.9 (L) 06/04/2013   PROT 8.2 06/04/2013   ALBUMIN 3.5 (L) 06/04/2013   AST 21 06/04/2013   ALT 18 06/04/2013   ALKPHOS 138 (H) 06/04/2013   BILITOT 0.3 06/04/2013    Lab Results  Component Value Date   WBC 6.5 02/04/2021   NEUTROABS 2.9 02/04/2021   HGB 13.3 02/04/2021   HCT 41.5 02/04/2021   MCV 76.0 (L) 02/04/2021   PLT 466 (H) 02/04/2021   Lab Results  Component Value Date   IRON 41 02/04/2021   TIBC 357 02/04/2021   IRONPCTSAT 12 02/04/2021   Lab Results  Component Value Date   FERRITIN 12 02/04/2021     STUDIES: No results found.  ASSESSMENT: Iron deficiency anemia.  PLAN:    Iron deficiency anemia: Her hemoglobin is within normal limits at 13.3 however there is evidence of microcytosis.  Ferritin is borderline low at 12, iron saturation 12%. B12 and folate are also within normal limits. Clinically she is asymptomatic. No intervention is needed at this time.  Patient does not require IV iron.  Return to clinic in 4 months with repeat laboratory work and further evaluation.  If her hemoglobin and iron stores continue to remain within normal limits, she likely can be discharged from clinic. Heavy menses: Resolved with Nexplanon implant.  Continue follow-up with OB/GYN as indicated. Thrombocytosis: Mild, monitor. Microcytosis: Persistent microcytosis despite improvement in iron counts.  Hemoglobin fractionation cascade was drawn and no  evidence of beta thalassemia was identified.  Will order alpha thalassemia DNA analysis to evaluate further.   4 mo- lab (cbc, ferritin, iron studies, alpha thalassemia) Day to week later see Grayland Ormond, +/- venofer - la  Patient expressed understanding and was in agreement with this plan. She also understands that She can call clinic at any time with any questions, concerns, or complaints.   Verlon Au, NP   02/08/2021

## 2021-02-08 NOTE — Progress Notes (Signed)
Patient here for oncology follow-up appointment, expresses no complaints or concerns at this time.    

## 2021-06-03 NOTE — Progress Notes (Signed)
 Tina Day is a 21 y.o. female presenting to clinic c/o burning during urination and urinary frequency x4 days.

## 2021-06-06 NOTE — Progress Notes (Unsigned)
[facility]  Telephone:(336) [phone] [phone]) [phone]  ID: Junie Bame OB: 06-17-2000  MR#: [id number]  MOL#:[id number]  Patient Care Team: Armando Gang, FNP as PCP - General (Family Medicine)  CHIEF COMPLAINT: Iron deficiency anemia.  INTERVAL HISTORY: Patient is a 21 year old female who was noted to have declining hemoglobin and iron stores on routine blood work likely secondary to heavy menses.  She currently feels well and is asymptomatic.  She does not complain of any weakness or fatigue.  She has a good appetite and denies weight loss.  She has no neurologic complaints.  She denies any recent fevers or illnesses.  She has no chest pain, shortness of breath, cough, or hemoptysis.  She denies any nausea, vomiting, constipation, or diarrhea.  She has no urinary complaints.  Patient feels at her baseline and offers no specific complaints today.  REVIEW OF SYSTEMS:   Review of Systems  Constitutional: Negative.  Negative for fever, malaise/fatigue and weight loss.  Respiratory: Negative.  Negative for cough, hemoptysis and shortness of breath.   Cardiovascular: Negative.  Negative for chest pain and leg swelling.  Gastrointestinal: Negative.  Negative for abdominal pain, blood in stool and melena.  Genitourinary: Negative.  Negative for hematuria.  Musculoskeletal: Negative.  Negative for back pain.  Skin: Negative.  Negative for rash.  Neurological: Negative.  Negative for dizziness, focal weakness, weakness and headaches.  Psychiatric/Behavioral: Negative.  The patient is not nervous/anxious.    As per HPI. Otherwise, a complete review of systems is negative.  PAST MEDICAL HISTORY: Past Medical History:  Diagnosis Date   Asthma    Diabetes ([facility])    Hyperlipidemia    Hypertension    Scoliosis     PAST SURGICAL HISTORY: No past surgical history on file.  FAMILY HISTORY: Family History  Problem Relation Age of Onset   Seizures Paternal Uncle     Diabetes Maternal Grandmother    Stroke Maternal Grandmother    Diabetes Maternal Grandfather    Cancer Paternal Grandmother    Stroke Paternal Grandmother    Cancer Paternal Grandfather    Thyroid disease Mother    Lupus Mother     ADVANCED DIRECTIVES (Y/N):  N  HEALTH MAINTENANCE: Social History   Tobacco Use   Smoking status: Never   Smokeless tobacco: Never  Vaping Use   Vaping Use: Never used  Substance Use Topics   Alcohol use: Never   Drug use: Never     Colonoscopy:  PAP:  Bone density:  Lipid panel:  Allergies  Allergen Reactions   Penicillins Swelling    Current Outpatient Medications  Medication Sig Dispense Refill   Acetaminophen (TYLENOL PO) Take by mouth.     adapalene (DIFFERIN) 0.1 % cream Apply pea sized amount to entire face, start 2-3 times per week. Then increase as tolerated. DISPENSE BRAND PER MEDICAID     Cholecalciferol (VITAMIN D3) 1.25 MG (50000 UT) CAPS Take 1 capsule by mouth once a week.     empagliflozin (JARDIANCE) 10 MG TABS tablet Take by mouth daily.     losartan (COZAAR) 25 MG tablet Take 12.5 mg by mouth daily.     medroxyPROGESTERone (DEPO-PROVERA) 150 MG/ML injection Inject 1 mL (150 mg total) into the muscle every 3 (three) months. (Patient not taking: Reported on 06/30/2020) 1 mL 3   metFORMIN (GLUCOPHAGE) 500 MG tablet Take 500 mg by mouth 2 (two) times daily. (Patient not taking: Reported on 06/30/2020)     pravastatin (PRAVACHOL) 10 MG  tablet SMARTSIG:1 Tablet(s) By Mouth Every Evening     spironolactone (ALDACTONE) 100 MG tablet Take by mouth.     No current facility-administered medications for this visit.    OBJECTIVE: There were no vitals filed for this visit.    There is no height or weight on file to calculate BMI.    ECOG FS:0 - Asymptomatic  General: Well-developed, well-nourished, no acute distress. Eyes: Pink conjunctiva, anicteric sclera. HEENT: Normocephalic, moist mucous membranes. Lungs: No audible  wheezing or coughing. Heart: Regular rate and rhythm. Abdomen: Soft, nontender, no obvious distention. Musculoskeletal: No edema, cyanosis, or clubbing. Neuro: Alert, answering all questions appropriately. Cranial nerves grossly intact. Skin: No rashes or petechiae noted. Psych: Normal affect. Lymphatics: No cervical, calvicular, axillary or inguinal LAD.   LAB RESULTS:  Lab Results  Component Value Date   NA 137 06/04/2013   K 3.8 06/04/2013   CL 103 06/04/2013   CO2 31 (H) 06/04/2013   GLUCOSE 86 06/04/2013   BUN 8 06/04/2013   CREATININE 0.62 06/04/2013   CALCIUM 8.9 (L) 06/04/2013   PROT 8.2 06/04/2013   ALBUMIN 3.5 (L) 06/04/2013   AST 21 06/04/2013   ALT 18 06/04/2013   ALKPHOS 138 (H) 06/04/2013   BILITOT 0.3 06/04/2013    Lab Results  Component Value Date   WBC 6.5 02/04/2021   NEUTROABS 2.9 02/04/2021   HGB 13.3 02/04/2021   HCT 41.5 02/04/2021   MCV 76.0 (L) 02/04/2021   PLT 466 (H) 02/04/2021   Lab Results  Component Value Date   IRON 41 02/04/2021   TIBC 357 02/04/2021   IRONPCTSAT 12 02/04/2021   Lab Results  Component Value Date   FERRITIN 12 02/04/2021     STUDIES: No results found.  ASSESSMENT: Iron deficiency anemia.  PLAN:    Iron deficiency anemia: Resolved.  Patient's hemoglobin and iron stores are now within normal limits.  B12 and folate are also within normal limits.  No intervention is needed at this time.  Patient does not require IV iron.  Return to clinic in 4 months with repeat laboratory work and further evaluation.  If her hemoglobin and iron stores continue to remain within normal limits, she likely can be discharged from clinic. Heavy menses: Resolved with implant.  Continue follow-up with OB/GYN as indicated. Thrombocytosis: Mild, monitor. Microcytosis: Unrelated to iron deficiency.  Will order hemoglobin electrophoresis with next lab draw.  Patient expressed understanding and was in agreement with this plan. She also  understands that She can call clinic at any time with any questions, concerns, or complaints.     Jeralyn Ruths, MD   06/06/2021 7:04 AM

## 2021-06-07 ENCOUNTER — Inpatient Hospital Stay: Payer: Medicaid Other | Attending: Oncology

## 2021-06-10 ENCOUNTER — Inpatient Hospital Stay: Payer: Medicaid Other | Admitting: Oncology

## 2021-06-10 ENCOUNTER — Inpatient Hospital Stay: Payer: Medicaid Other

## 2021-06-10 ENCOUNTER — Encounter: Payer: Self-pay | Admitting: Oncology

## 2021-06-10 DIAGNOSIS — D509 Iron deficiency anemia, unspecified: Secondary | ICD-10-CM

## 2021-07-06 ENCOUNTER — Encounter: Payer: Self-pay | Admitting: Oncology

## 2021-08-16 ENCOUNTER — Other Ambulatory Visit: Payer: Self-pay

## 2021-08-16 ENCOUNTER — Encounter: Payer: Self-pay | Admitting: Oncology

## 2021-08-16 ENCOUNTER — Emergency Department
Admission: EM | Admit: 2021-08-16 | Discharge: 2021-08-16 | Disposition: A | Discharge status: Home / Self Care | Payer: Medicaid Other | Attending: Emergency Medicine | Admitting: Emergency Medicine

## 2021-08-16 DIAGNOSIS — J45909 Unspecified asthma, uncomplicated: Secondary | ICD-10-CM | POA: Insufficient documentation

## 2021-08-16 DIAGNOSIS — R102 Pelvic and perineal pain: Secondary | ICD-10-CM | POA: Diagnosis present

## 2021-08-16 DIAGNOSIS — I10 Essential (primary) hypertension: Secondary | ICD-10-CM | POA: Insufficient documentation

## 2021-08-16 DIAGNOSIS — N72 Inflammatory disease of cervix uteri: Secondary | ICD-10-CM | POA: Diagnosis not present

## 2021-08-16 DIAGNOSIS — E119 Type 2 diabetes mellitus without complications: Secondary | ICD-10-CM | POA: Diagnosis not present

## 2021-08-16 LAB — CHLAMYDIA/NGC RT PCR (ARMC ONLY)
Chlamydia Tr: NOT DETECTED
Chlamydia Tr: NOT DETECTED
N gonorrhoeae: NOT DETECTED
N gonorrhoeae: NOT DETECTED

## 2021-08-16 LAB — WET PREP, GENITAL
Clue Cells Wet Prep HPF POC: NONE SEEN
Sperm: NONE SEEN
Trich, Wet Prep: NONE SEEN
WBC, Wet Prep HPF POC: >=10 — AB (ref <10)
Yeast Wet Prep HPF POC: NONE SEEN

## 2021-08-16 LAB — URINALYSIS, ROUTINE W REFLEX MICROSCOPIC
Bilirubin Urine: NEGATIVE
Glucose, UA: NEGATIVE mg/dL
Hgb urine dipstick: NEGATIVE
Ketones, ur: 20 mg/dL — AB
Leukocytes,Ua: NEGATIVE
Nitrite: NEGATIVE
Protein, ur: NEGATIVE mg/dL
Specific Gravity, Urine: 1.025 (ref 1.005–1.030)
pH: 6 (ref 5.0–8.0)

## 2021-08-16 LAB — POC URINE PREG, ED: Preg Test, Ur: NEGATIVE

## 2021-08-16 MED ORDER — CEFTRIAXONE SODIUM 1 G IJ SOLR
500.0000 mg | Freq: Once | INTRAMUSCULAR | Status: AC
  Administered 2021-08-16: 500 mg via INTRAMUSCULAR
  Filled 2021-08-16: qty 10

## 2021-08-16 MED ORDER — LIDOCAINE HCL (PF) 1 % IJ SOLN
5.0000 mL | Freq: Once | INTRAMUSCULAR | Status: AC
  Administered 2021-08-16: 5 mL
  Filled 2021-08-16: qty 5

## 2021-08-16 MED ORDER — DOXYCYCLINE HYCLATE 100 MG PO CAPS: 100.0000 mg | ORAL_CAPSULE | Freq: Two times a day (BID) | ORAL | 0 refills | Status: AC

## 2021-08-16 NOTE — ED Provider Triage Note (Signed)
Emergency Medicine Provider Triage Evaluation Note  Tina Day , a 21 y.o. female  was evaluated in triage.  Pt complains of vaginal pain, concerns for STD.  States she had sex with a new partner and did not use protection but has had some painful irritation in the vaginal area along with discomfort when urinating.  Review of Systems  Positive: See above Negative: Fever  Physical Exam  BP (!) 154/88 (BP Location: Right Arm)   Pulse 94   Temp 99.3 F (37.4 C) (Oral)   Resp 16   Ht 5\' 1"  (1.549 m)   Wt 77.1 kg   SpO2 97%   BMI 32.12 kg/m  Gen:   Awake, no distress   Resp:  Normal effort  MSK:   Moves extremities without difficulty  Other:    Medical Decision Making  Medically screening exam initiated at 3:54 PM.  Appropriate orders placed.  Tina Day was informed that the remainder of the evaluation will be completed by another provider, this initial triage assessment does not replace that evaluation, and the importance of remaining in the ED until their evaluation is complete.  Wet prep, GC/chlamydia, urinalysis and POC pregnancy were   Junie Bame, PA-C 08/16/21 1555

## 2021-08-16 NOTE — ED Triage Notes (Addendum)
Pt presents for STD check and vaginal/perineal discomfort.  Pt reports new sexual partner recently.  Denies dysuria, discharge, or rash.  Sts "it's red and sore, like it was too rough."    After triage, Pt mentioned her urine is cloudy and some low back discomfort.

## 2021-08-16 NOTE — ED Notes (Signed)
Pt self-swabbed for specimens.  This Clinical research associate re-educated Pt on procedure.

## 2021-08-16 NOTE — ED Provider Notes (Signed)
[facility] Provider Note    Event Date/Time   First MD Initiated Contact with Patient 08/16/21 1619     (approximate)   History   Chief Complaint SEXUALLY TRANSMITTED DISEASE   HPI  Tina Day is a 21 y.o. female with past medical history of hypertension, hyperlipidemia, diabetes, asthma, and bipolar disorder who presents to the ED complaining of pelvic pain.  Patient reports that for the past 2 days she has been dealing with feeling of swelling and pain in her pelvic area.  She states it feels red, irritated, and swollen around the entrance to her vagina, but she has not noticed any bleeding or discharge.  She states she does not get a regular period since placement of Nexplanon, denies any dysuria or hematuria.  She does state that she recently had a new sexual partner, does state that she has been using protection.     Physical Exam   Triage Vital Signs: ED Triage Vitals  Enc Vitals Group     BP 08/16/21 1551 (!) 154/88     Pulse Rate 08/16/21 1551 94     Resp 08/16/21 1551 16     Temp 08/16/21 1551 99.3 F (37.4 C)     Temp Source 08/16/21 1551 Oral     SpO2 08/16/21 1551 97 %     Weight 08/16/21 1553 170 lb (77.1 kg)     Height 08/16/21 1553 5\' 1"  (1.549 m)     Head Circumference --      Peak Flow --      Pain Score 08/16/21 1552 9     Pain Loc --      Pain Edu? --      Excl. in [place]? --     Most recent vital signs: Vitals:   08/16/21 1551  BP: (!) 154/88  Pulse: 94  Resp: 16  Temp: 99.3 F (37.4 C)  SpO2: 97%    Constitutional: Alert and oriented. Eyes: Conjunctivae are normal. Head: Atraumatic. Nose: No congestion/rhinnorhea. Mouth/Throat: Mucous membranes are moist.  Cardiovascular: Normal rate, regular rhythm. Grossly normal heart sounds.  2+ radial pulses bilaterally. Respiratory: Normal respiratory effort.  No retractions. Lungs CTAB. Gastrointestinal: Soft and nontender. No distention. Genitourinary: Cervix  erythematous with small amount of purulent drainage, no cervical motion or adnexal tenderness noted.  No vulvar edema, erythema, or fluctuance noted. Musculoskeletal: No lower extremity tenderness nor edema.  Neurologic:  Normal speech and language. No gross focal neurologic deficits are appreciated.    ED Results / Procedures / Treatments   Labs (all labs ordered are listed, but only abnormal results are displayed) Labs Reviewed  WET PREP, GENITAL - Abnormal; Notable for the following components:      Result Value   WBC, Wet Prep HPF POC >=10 (*)    All other components within normal limits  URINALYSIS, ROUTINE W REFLEX MICROSCOPIC - Abnormal; Notable for the following components:   Color, Urine YELLOW (*)    APPearance CLEAR (*)    Ketones, ur 20 (*)    All other components within normal limits  CHLAMYDIA/NGC RT PCR ([facility] ONLY)            CHLAMYDIA/NGC RT PCR ([facility] ONLY)            POC URINE PREG, ED    PROCEDURES:  Critical Care performed: No  Procedures   MEDICATIONS ORDERED IN ED: Medications  cefTRIAXone (ROCEPHIN) injection 500 mg (500 mg Intramuscular Given 08/16/21 1708)  lidocaine (PF) (  XYLOCAINE) 1 % injection 5 mL (5 mLs Other Given 08/16/21 1712)     IMPRESSION / MDM / ASSESSMENT AND PLAN / ED COURSE  I reviewed the triage vital signs and the nursing notes.                              21 y.o. female with past medical history of hypertension, hyperlipidemia, diabetes, asthma, and bipolar disorder who presents to the ED complaining of pelvic pain and swelling for the past 2 days after having a new sexual partner.  Patient's presentation is most consistent with acute complicated illness / injury requiring diagnostic workup.  Differential diagnosis includes, but is not limited to, cervicitis, PID, Bartholin's gland abscess, vaginitis, vaginosis, pregnancy.  Patient well-appearing and in no acute distress, vital signs are unremarkable and she has a benign  abdominal exam.  Pelvic exam does show erythematous cervix with small amount of purulent drainage, GC/chlamydia testing is pending but patient is agreeable to proceed with empiric treatment.  No findings to suggest PID.  Pregnancy testing is negative and urinalysis shows no signs of infection, wet prep is also unremarkable.  She is appropriate for discharge home with follow-up with the health department, was counseled to return to the ED for new or worsening symptoms.  Patient agrees with plan.      FINAL CLINICAL IMPRESSION(S) / ED DIAGNOSES   Final diagnoses:  Cervicitis     Rx / DC Orders   ED Discharge Orders          Ordered    doxycycline (VIBRAMYCIN) 100 MG capsule  2 times daily        08/16/21 1747             Note:  This document was prepared using Dragon voice recognition software and may include unintentional dictation errors.   Chesley Noon, MD 08/16/21 [phone]

## 2021-10-09 ENCOUNTER — Encounter: Payer: Self-pay | Admitting: Oncology

## 2021-10-09 ENCOUNTER — Encounter: Payer: Self-pay | Admitting: Intensive Care

## 2021-10-09 ENCOUNTER — Emergency Department
Admission: EM | Admit: 2021-10-09 | Discharge: 2021-10-09 | Disposition: A | Discharge status: Home / Self Care | Payer: Medicaid Other | Attending: Emergency Medicine | Admitting: Emergency Medicine

## 2021-10-09 ENCOUNTER — Other Ambulatory Visit: Payer: Self-pay

## 2021-10-09 DIAGNOSIS — E119 Type 2 diabetes mellitus without complications: Secondary | ICD-10-CM | POA: Diagnosis not present

## 2021-10-09 DIAGNOSIS — M79601 Pain in right arm: Secondary | ICD-10-CM | POA: Diagnosis not present

## 2021-10-09 DIAGNOSIS — M79604 Pain in right leg: Secondary | ICD-10-CM | POA: Diagnosis not present

## 2021-10-09 DIAGNOSIS — I10 Essential (primary) hypertension: Secondary | ICD-10-CM | POA: Diagnosis not present

## 2021-10-09 DIAGNOSIS — J45909 Unspecified asthma, uncomplicated: Secondary | ICD-10-CM | POA: Insufficient documentation

## 2021-10-09 LAB — CBC WITH DIFFERENTIAL/PLATELET
Abs Immature Granulocytes: 0.03 10*3/uL (ref 0.00–0.07)
Basophils Absolute: 0 10*3/uL (ref 0.0–0.1)
Basophils Relative: 0 %
Eosinophils Absolute: 0.1 10*3/uL (ref 0.0–0.5)
Eosinophils Relative: 1 %
HCT: 43.4 % (ref 36.0–46.0)
Hemoglobin: 13.9 g/dL (ref 12.0–15.0)
Immature Granulocytes: 0 %
Lymphocytes Relative: 44 %
Lymphs Abs: 3.7 10*3/uL (ref 0.7–4.0)
MCH: 25.1 pg — ABNORMAL LOW (ref 26.0–34.0)
MCHC: 32 g/dL (ref 30.0–36.0)
MCV: 78.5 fL — ABNORMAL LOW (ref 80.0–100.0)
Monocytes Absolute: 0.4 10*3/uL (ref 0.1–1.0)
Monocytes Relative: 4 %
Neutro Abs: 4.3 10*3/uL (ref 1.7–7.7)
Neutrophils Relative %: 51 %
Platelets: 491 10*3/uL — ABNORMAL HIGH (ref 150–400)
RBC: 5.53 MIL/uL — ABNORMAL HIGH (ref 3.87–5.11)
RDW: 14.2 % (ref 11.5–15.5)
WBC: 8.5 10*3/uL (ref 4.0–10.5)
nRBC: 0 % (ref 0.0–0.2)

## 2021-10-09 LAB — BASIC METABOLIC PANEL
Anion gap: 8 (ref 5–15)
BUN: 12 mg/dL (ref 6–20)
CO2: 20 mmol/L — ABNORMAL LOW (ref 22–32)
Calcium: 9.2 mg/dL (ref 8.9–10.3)
Chloride: 110 mmol/L (ref 98–111)
Creatinine, Ser: 0.64 mg/dL (ref 0.44–1.00)
GFR, Estimated: >60 mL/min (ref >60)
Glucose, Bld: 86 mg/dL (ref 70–99)
Potassium: 3.8 mmol/L (ref 3.5–5.1)
Sodium: 138 mmol/L (ref 135–145)

## 2021-10-09 LAB — POC URINE PREG, ED: Preg Test, Ur: NEGATIVE

## 2021-10-09 MED ORDER — KETOROLAC TROMETHAMINE 30 MG/ML IJ SOLN
30.0000 mg | Freq: Once | INTRAMUSCULAR | Status: AC
Start: 2021-10-09 — End: 2021-10-09
  Administered 2021-10-09: 30 mg via INTRAMUSCULAR
  Filled 2021-10-09: qty 1

## 2021-10-09 NOTE — ED Triage Notes (Signed)
Patient c/o intermittent right arm and leg pain X1 month

## 2021-10-09 NOTE — ED Provider Notes (Signed)
[facility] Provider Note    Event Date/Time   First MD Initiated Contact with Patient 10/09/21 1754     (approximate)   History   Chief Complaint Arm Pain and Leg Pain   HPI  Tina Day is a 21 y.o. female with past medical history of hypertension, hyperlipidemia, diabetes, asthma, and bipolar disorder who presents to the ED complaining of arm and leg pain.  Patient reports that she has been dealing with a couple of months of intermittent pain in the right side of her body, primarily in her right arm and right leg.  She states that the pain seems to shoot down both of these extremities and is exacerbated when she tries to move them.  She states they will occasionally feel numb and tingly, but she denies any associated weakness.  She denies any associated neck pain and has not had any recent traumatic injuries.  She states she was seen by a "spine specialist" for this problem but was told there was nothing they could do.  She has been taking over-the-counter pain medication without significant relief.     Physical Exam   Triage Vital Signs: ED Triage Vitals  Enc Vitals Group     BP 10/09/21 1750 (!) 133/90     Pulse Rate 10/09/21 1750 96     Resp 10/09/21 1750 16     Temp 10/09/21 1750 98.4 F (36.9 C)     Temp Source 10/09/21 1750 Oral     SpO2 10/09/21 1750 96 %     Weight 10/09/21 1745 185 lb (83.9 kg)     Height 10/09/21 1745 5\' 1"  (1.549 m)     Head Circumference --      Peak Flow --      Pain Score 10/09/21 1745 7     Pain Loc --      Pain Edu? --      Excl. in [place]? --     Most recent vital signs: Vitals:   10/09/21 1750  BP: (!) 133/90  Pulse: 96  Resp: 16  Temp: 98.4 F (36.9 C)  SpO2: 96%    Constitutional: Alert and oriented. Eyes: Conjunctivae are normal. Head: Atraumatic. Nose: No congestion/rhinnorhea. Mouth/Throat: Mucous membranes are moist.  Neck: No midline cervical spine tenderness to palpation. Cardiovascular:  Normal rate, regular rhythm. Grossly normal heart sounds.  2+ radial and DP pulses bilaterally. Respiratory: Normal respiratory effort.  No retractions. Lungs CTAB. Gastrointestinal: Soft and nontender. No distention. Musculoskeletal: No lower extremity tenderness nor edema.  Neurologic:  Normal speech and language. No gross focal neurologic deficits are appreciated.    ED Results / Procedures / Treatments   Labs (all labs ordered are listed, but only abnormal results are displayed) Labs Reviewed  CBC WITH DIFFERENTIAL/PLATELET - Abnormal; Notable for the following components:      Result Value   RBC 5.53 (*)    MCV 78.5 (*)    MCH 25.1 (*)    Platelets 491 (*)    All other components within normal limits  BASIC METABOLIC PANEL - Abnormal; Notable for the following components:   CO2 20 (*)    All other components within normal limits  POC URINE PREG, ED    PROCEDURES:  Critical Care performed: No  Procedures   MEDICATIONS ORDERED IN ED: Medications  ketorolac (TORADOL) 30 MG/ML injection 30 mg (30 mg Intramuscular Given 10/09/21 1841)     IMPRESSION / MDM / [place] / ED  COURSE  I reviewed the triage vital signs and the nursing notes.                              21 y.o. female with no significant past medical history who presents to the ED complaining of pain shooting down her right arm and right leg intermittently for at least the past month, denies recent trauma.  Patient's presentation is most consistent with acute presentation with potential threat to life or bodily function.  Differential diagnosis includes, but is not limited to, stroke, cervical myelopathy, cervical radiculopathy, arterial insufficiency, DVT, cellulitis, electrolyte abnormality, pregnancy.  Patient nontoxic-appearing and in no acute distress, vital signs are unremarkable.  She is neurovascular intact to all 4 extremities, no focal neurologic deficits noted on exam.  There are no  signs of DVT or infectious process to explain her symptoms.  Cervical myelopathy seems unlikely and with no focal weakness, do not feel MRI is indicated at this time.  She is diabetic and we will screen labs for electrolyte abnormality that could explain her symptoms, also check pregnancy testing.  Labs are reassuring with no significant anemia, leukocytosis, electrolyte abnormality, or AKI.  Pregnancy testing is negative, patient reports improvement in pain following dose of IM Toradol.  Would consider radiculopathy as the cause of her pain, but again no indication for MRI given no focal neurologic deficit.  She would benefit from outpatient neurology evaluation as well as PCP follow-up, was counseled to return to the ED for new or worsening symptoms.  Patient and mother agree with plan.      FINAL CLINICAL IMPRESSION(S) / ED DIAGNOSES   Final diagnoses:  Right arm pain  Right leg pain     Rx / DC Orders   ED Discharge Orders     None        Note:  This document was prepared using Dragon voice recognition software and may include unintentional dictation errors.   Chesley Noon, MD 10/09/21 [phone]

## 2022-01-20 ENCOUNTER — Encounter: Payer: Self-pay | Admitting: Oncology

## 2022-01-26 ENCOUNTER — Encounter: Payer: Self-pay | Admitting: Oncology

## 2022-06-17 ENCOUNTER — Encounter: Payer: Self-pay | Admitting: Oncology

## 2022-06-17 ENCOUNTER — Other Ambulatory Visit: Payer: Self-pay

## 2022-06-17 ENCOUNTER — Emergency Department
Admission: EM | Admit: 2022-06-17 | Discharge: 2022-06-17 | Disposition: A | Discharge status: Home / Self Care | Payer: Medicaid Other | Attending: Emergency Medicine | Admitting: Emergency Medicine

## 2022-06-17 ENCOUNTER — Encounter: Payer: Self-pay | Admitting: Emergency Medicine

## 2022-06-17 DIAGNOSIS — J029 Acute pharyngitis, unspecified: Secondary | ICD-10-CM | POA: Insufficient documentation

## 2022-06-17 NOTE — ED Provider Notes (Signed)
   [facility] Provider Note    Event Date/Time   First MD Initiated Contact with Patient 06/17/22 1402     (approximate)   History   Sore Throat   HPI  Tina Day is a 22 y.o. female who presents with complaints of mild sore throat.  Patient feels that her lymph nodes may be swollen as well.  She is also concerned because of bumps on the back of her tongue.  No fevers reported.  No difficulty swallowing.   Physical Exam   Triage Vital Signs: ED Triage Vitals  Enc Vitals Group     BP 06/17/22 1402 (!) 142/85     Pulse Rate 06/17/22 1402 87     Resp 06/17/22 1402 16     Temp 06/17/22 1402 98.5 F (36.9 C)     Temp Source 06/17/22 1402 Oral     SpO2 06/17/22 1402 100 %     Weight --      Height --      Head Circumference --      Peak Flow --      Pain Score 06/17/22 1403 0     Pain Loc --      Pain Edu? --      Excl. in GC? --     Most recent vital signs: Vitals:   06/17/22 1402  BP: (!) 142/85  Pulse: 87  Resp: 16  Temp: 98.5 F (36.9 C)  SpO2: 100%     General: Awake, no distress.  CV:  Good peripheral perfusion.  Resp:  Normal effort.  Abd:  No distention.  Other:  Pharyngeal exam is normal: Bumps that she is referring to are taste buds   ED Results / Procedures / Treatments   Labs (all labs ordered are listed, but only abnormal results are displayed) Labs Reviewed - No data to display   EKG     RADIOLOGY     PROCEDURES:  Critical Care performed:   Procedures   MEDICATIONS ORDERED IN ED: Medications - No data to display   IMPRESSION / MDM / ASSESSMENT AND PLAN / ED COURSE  I reviewed the triage vital signs and the nursing notes. Patient's presentation is most consistent with acute, uncomplicated illness.  Patient with mild symptoms, complains of scratchy throat, swollen lymph nodes, likely viral illness, pharyngeal exam is normal, no swelling redness or signs of strep throat.  Reassurance provided,  recommend supportive care, outpatient follow-up as needed.        FINAL CLINICAL IMPRESSION(S) / ED DIAGNOSES   Final diagnoses:  Sore throat     Rx / DC Orders   ED Discharge Orders     None        Note:  This document was prepared using Dragon voice recognition software and may include unintentional dictation errors.   Jene Every, MD 06/17/22 1452

## 2022-06-17 NOTE — ED Triage Notes (Signed)
Pt to ED via POV for sore throat and swollen lymph nodes. Pt states that this started after performing oral sex. Pt denies fevers and chills. Pt also concerned about bumps on the back of her tongue.

## 2022-06-18 ENCOUNTER — Emergency Department
Admission: EM | Admit: 2022-06-18 | Discharge: 2022-06-19 | Disposition: A | Discharge status: Home / Self Care | Payer: Medicaid Other | Attending: Emergency Medicine | Admitting: Emergency Medicine

## 2022-06-18 ENCOUNTER — Other Ambulatory Visit: Payer: Self-pay

## 2022-06-18 DIAGNOSIS — J029 Acute pharyngitis, unspecified: Secondary | ICD-10-CM | POA: Insufficient documentation

## 2022-06-18 DIAGNOSIS — N898 Other specified noninflammatory disorders of vagina: Secondary | ICD-10-CM | POA: Diagnosis present

## 2022-06-18 DIAGNOSIS — N760 Acute vaginitis: Secondary | ICD-10-CM | POA: Diagnosis not present

## 2022-06-18 DIAGNOSIS — B9689 Other specified bacterial agents as the cause of diseases classified elsewhere: Secondary | ICD-10-CM | POA: Diagnosis not present

## 2022-06-18 LAB — CHLAMYDIA/NGC RT PCR (ARMC ONLY)
Chlamydia Tr: NOT DETECTED
N gonorrhoeae: NOT DETECTED

## 2022-06-18 LAB — WET PREP, GENITAL
Sperm: NONE SEEN
Trich, Wet Prep: NONE SEEN
WBC, Wet Prep HPF POC: <10 — AB (ref <10)
Yeast Wet Prep HPF POC: NONE SEEN

## 2022-06-18 LAB — GROUP A STREP BY PCR: Group A Strep by PCR: NOT DETECTED

## 2022-06-18 MED ORDER — METRONIDAZOLE 500 MG PO TABS: 500.0000 mg | ORAL_TABLET | Freq: Two times a day (BID) | ORAL | 0 refills | Status: AC

## 2022-06-18 NOTE — ED Provider Notes (Signed)
[facility] Provider Note    Event Date/Time   First MD Initiated Contact with Patient 06/18/22 2041     (approximate)   History   Sore Throat   HPI  Tina Day is a 22 y.o. female with PMH of diabetes and asthma who presents for evaluation of sore throat.  She was seen in this ED yesterday and it was thought to be a viral pharyngitis.  She reports she has swollen lymph nodes and describes her pain as a burning.  She mentions that this first began after performing oral sex on Monday.  She also states that she would like to be tested for yeast infection as she has noticed increased vaginal discharge with a abnormal odor.  She states she has not had vaginal intercourse in the last year. She denies any fevers, urinary symptoms and pelvic pain.      Physical Exam   Triage Vital Signs: ED Triage Vitals [06/18/22 1953]  Enc Vitals Group     BP 135/82     Pulse Rate (!) 105     Resp 16     Temp 98 F (36.7 C)     Temp Source Oral     SpO2 100 %     Weight 195 lb (88.5 kg)     Height 5\' 1"  (1.549 m)     Head Circumference      Peak Flow      Pain Score 0     Pain Loc      Pain Edu?      Excl. in GC?     Most recent vital signs: Vitals:   06/18/22 1953  BP: 135/82  Pulse: (!) 105  Resp: 16  Temp: 98 F (36.7 C)  SpO2: 100%   General: Awake, no distress.  CV:  Good peripheral perfusion.  Resp:  Normal effort.  Abd:  No distention.  Other:  No erythema noted in the pharynx, no tonsillar enlargement or exudates, tender cervical lymphadenopathy.   ED Results / Procedures / Treatments   Labs (all labs ordered are listed, but only abnormal results are displayed) Labs Reviewed  WET PREP, GENITAL - Abnormal; Notable for the following components:      Result Value   Clue Cells Wet Prep HPF POC PRESENT (*)    WBC, Wet Prep HPF POC <10 (*)    All other components within normal limits  GROUP A STREP BY PCR  CHLAMYDIA/NGC RT PCR ([facility] ONLY)                PROCEDURES:  Critical Care performed: No  Procedures   MEDICATIONS ORDERED IN ED: Medications - No data to display   IMPRESSION / MDM / ASSESSMENT AND PLAN / ED COURSE  I reviewed the triage vital signs and the nursing notes.                              Differential diagnosis includes, but is not limited to, oral gonorrhea/chlamydia infection, strep throat, viral pharyngitis, yeast infection, bacterial vaginosis.   Patient's presentation is most consistent with acute complicated illness / injury requiring diagnostic workup.  Patient presented to the ED with complaints of a sore throat and vaginal discharge.  Although pharyngeal exam was unremarkable, strep swab was ordered from triage which was negative.  Based on patient's history of performing oral sex I tested for gonorrhea and chlamydia orally which also came back  negative.  I believe her sore throat presentation is most consistent with a viral pharyngitis.  I did a wet prep to evaluate patient's vaginal discharge, which had evidence of clue cells.  Patient was treated for bacterial vaginosis, a course of oral metronidazole was sent to her pharmacy.  I advised patient on symptomatic management of her sore throat and advise she follow-up with her primary care if she is still symptomatic in a week.  I educated patient about sexual health.  Patient verbalized understanding, all questions were answered, was stable at discharge.   FINAL CLINICAL IMPRESSION(S) / ED DIAGNOSES   Final diagnoses:  Bacterial vaginosis  Sore throat     Rx / DC Orders   ED Discharge Orders          Ordered    metroNIDAZOLE (FLAGYL) 500 MG tablet  2 times daily        06/18/22 2332             Note:  This document was prepared using Dragon voice recognition software and may include unintentional dictation errors.   Cameron Ali, PA-C 06/18/22 2347    Sharman Cheek, MD 06/20/22 [phone]

## 2022-06-18 NOTE — ED Triage Notes (Signed)
Pt to ed from home for same that she was seen for yesterday. Pt has swollen lymph nodes and burning in her throat that started after performing oral sex. Pt is CAOx4, in no acute distress, and ambulatory in triage.

## 2022-10-21 ENCOUNTER — Encounter: Payer: Self-pay | Admitting: Oncology

## 2022-10-25 ENCOUNTER — Ambulatory Visit: Payer: MEDICAID | Admitting: Family

## 2022-10-25 ENCOUNTER — Encounter: Payer: Self-pay | Admitting: Family

## 2022-10-25 DIAGNOSIS — R599 Enlarged lymph nodes, unspecified: Secondary | ICD-10-CM

## 2022-10-25 DIAGNOSIS — E538 Deficiency of other specified B group vitamins: Secondary | ICD-10-CM

## 2022-10-25 DIAGNOSIS — R5383 Other fatigue: Secondary | ICD-10-CM

## 2022-10-25 DIAGNOSIS — D509 Iron deficiency anemia, unspecified: Secondary | ICD-10-CM

## 2022-10-25 DIAGNOSIS — F319 Bipolar disorder, unspecified: Secondary | ICD-10-CM | POA: Diagnosis not present

## 2022-10-25 DIAGNOSIS — E1165 Type 2 diabetes mellitus with hyperglycemia: Secondary | ICD-10-CM | POA: Diagnosis not present

## 2022-10-25 DIAGNOSIS — I159 Secondary hypertension, unspecified: Secondary | ICD-10-CM

## 2022-10-25 DIAGNOSIS — E559 Vitamin D deficiency, unspecified: Secondary | ICD-10-CM | POA: Diagnosis not present

## 2022-10-25 DIAGNOSIS — N911 Secondary amenorrhea: Secondary | ICD-10-CM | POA: Diagnosis not present

## 2022-10-25 DIAGNOSIS — E0869 Diabetes mellitus due to underlying condition with other specified complication: Secondary | ICD-10-CM

## 2022-10-25 DIAGNOSIS — Z23 Encounter for immunization: Secondary | ICD-10-CM | POA: Diagnosis not present

## 2022-10-25 DIAGNOSIS — E782 Mixed hyperlipidemia: Secondary | ICD-10-CM

## 2022-10-25 LAB — POCT CBG (FASTING - GLUCOSE)-MANUAL ENTRY: Glucose Fasting, POC: 96 mg/dL (ref 70–99)

## 2022-10-25 NOTE — Progress Notes (Unsigned)
New Patient Office Visit  Subjective    Patient ID: Tina Day, female    DOB: [birth year]  Age: 22 y.o. MRN: [id number]  CC: No chief complaint on file.   HPI Larson Kleiner presents to establish care Previous Primary Care provider/office: Franco Nones  she does have additional concerns to discuss today.   Lump on neck seems like its gotten bigger and wanted to have this checked out.   She says that she doesn't have any other major concerns today, but needed a new PCP.   Iron low, Vitamin D low, Diabetes, HTN,    Outpatient Encounter Medications as of 10/25/2022  Medication Sig  . Acetaminophen (TYLENOL PO) Take by mouth.  . empagliflozin (JARDIANCE) 10 MG TABS tablet Take by mouth daily.  Marland Kitchen losartan (COZAAR) 25 MG tablet Take 12.5 mg by mouth daily.  . pravastatin (PRAVACHOL) 10 MG tablet SMARTSIG:1 Tablet(s) By Mouth Every Evening  . [DISCONTINUED] Cholecalciferol (VITAMIN D3) 1.25 MG (50000 UT) CAPS Take 1 capsule by mouth once a week.  . [DISCONTINUED] adapalene (DIFFERIN) 0.1 % cream Apply pea sized amount to entire face, start 2-3 times per week. Then increase as tolerated. DISPENSE BRAND PER MEDICAID (Patient not taking: Reported on 10/25/2022)  . [DISCONTINUED] medroxyPROGESTERone (DEPO-PROVERA) 150 MG/ML injection Inject 1 mL (150 mg total) into the muscle every 3 (three) months. (Patient not taking: Reported on 06/30/2020)  . [DISCONTINUED] metFORMIN (GLUCOPHAGE) 500 MG tablet Take 500 mg by mouth 2 (two) times daily. (Patient not taking: Reported on 06/30/2020)  . [DISCONTINUED] spironolactone (ALDACTONE) 100 MG tablet Take by mouth. (Patient not taking: Reported on 10/25/2022)   No facility-administered encounter medications on file as of 10/25/2022.    Past Medical History:  Diagnosis Date  . Asthma   . Diabetes (HCC)   . Hyperlipidemia   . Hypertension   . Scoliosis   . Self-mutilation age 81-2017 06/30/2020    No past surgical history on file.  Family  History  Problem Relation Age of Onset  . Seizures Paternal Uncle   . Diabetes Maternal Grandmother   . Stroke Maternal Grandmother   . Diabetes Maternal Grandfather   . Cancer Paternal Grandmother   . Stroke Paternal Grandmother   . Cancer Paternal Grandfather   . Thyroid disease Mother   . Lupus Mother     Social History   Socioeconomic History  . Marital status: Single    Spouse name: Not on file  . Number of children: Not on file  . Years of education: Not on file  . Highest education level: Not on file  Occupational History  . Not on file  Tobacco Use  . Smoking status: Never  . Smokeless tobacco: Never  Vaping Use  . Vaping status: Never Used  Substance and Sexual Activity  . Alcohol use: Never  . Drug use: Never  . Sexual activity: Yes    Partners: Male    Birth control/protection: Condom, Implant  Other Topics Concern  . Not on file  Social History Narrative  . Not on file   Social Determinants of Health   Financial Resource Strain: Not on file  Food Insecurity: Not on file  Transportation Needs: Not on file  Physical Activity: Not on file  Stress: Not on file  Social Connections: Not on file  Intimate Partner Violence: Not on file    Review of Systems  All other systems reviewed and are negative.       Objective    BP 128/82  Pulse 84   Ht 5\' 1"  (1.549 m)   Wt 200 lb (90.7 kg)   PF 97 L/min   BMI 37.79 kg/m   Physical Exam Vitals and nursing note reviewed.  Constitutional:      Appearance: Normal appearance. She is normal weight.  HENT:     Head: Normocephalic.  Eyes:     Extraocular Movements: Extraocular movements intact.     Conjunctiva/sclera: Conjunctivae normal.     Pupils: Pupils are equal, round, and reactive to light.  Cardiovascular:     Rate and Rhythm: Normal rate.  Pulmonary:     Effort: Pulmonary effort is normal.  Neurological:     General: No focal deficit present.     Mental Status: She is alert and oriented  to person, place, and time. Mental status is at baseline.  Psychiatric:        Mood and Affect: Mood normal.        Behavior: Behavior normal.        Thought Content: Thought content normal.       Assessment & Plan:   Problem List Items Addressed This Visit       Active Problems   Diabetes (HCC) - Primary   Relevant Orders   ACTH (Completed)   CMP14+EGFR (Completed)   Hemoglobin A1c (Completed)   CBC with Differential/Platelet   POCT CBG (Fasting - Glucose) (Completed)   ACTH (Completed)   CMP14+EGFR (Completed)   Hemoglobin A1c (Completed)   CBC with Differential/Platelet   Hypertension   Relevant Orders   ACTH (Completed)   CMP14+EGFR (Completed)   CBC with Differential/Platelet   Bipolar 1 disorder (HCC) dx'd 2021   Relevant Orders   CMP14+EGFR (Completed)   CBC with Differential/Platelet   Iron deficiency anemia   Relevant Orders   CMP14+EGFR (Completed)   CBC with Differential/Platelet   Iron, TIBC and Ferritin Panel (Completed)   Class 2 severe obesity due to excess calories with serious comorbidity and body mass index (BMI) of 35.0 to 35.9 in adult [facility])   Other Visit Diagnoses     Secondary amenorrhea       Relevant Orders   ACTH (Completed)   CMP14+EGFR (Completed)   CBC with Differential/Platelet   Vitamin D deficiency, unspecified       Relevant Orders   VITAMIN D 25 Hydroxy (Vit-D Deficiency, Fractures) (Completed)   CMP14+EGFR (Completed)   CBC with Differential/Platelet   Mixed hyperlipidemia       Relevant Orders   Lipid panel (Completed)   CMP14+EGFR (Completed)   CBC with Differential/Platelet   Other fatigue       Relevant Orders   CMP14+EGFR (Completed)   TSH (Completed)   CBC with Differential/Platelet   B12 deficiency due to diet       Relevant Orders   CMP14+EGFR (Completed)   Vitamin B12 (Completed)   CBC with Differential/Platelet   Needs flu shot       Relevant Orders   Influenza, MDCK, trivalent, PF(Flucelvax egg-free)  (Completed)      Checking labs today Will discuss in detail at follow up.   Will also get ultrasound of her neck to evaluate the lump.    No follow-ups on file.   Total time spent: 30 minutes  Miki Kins, FNP  10/25/2022  This document may have been prepared by [facility] Voice Recognition software and as such may include unintentional dictation errors.

## 2022-10-26 LAB — ACTH: ACTH: 10.1 pg/mL (ref 7.2–63.3)

## 2022-10-26 LAB — CMP14+EGFR
ALT: 22 [IU]/L (ref 0–32)
AST: 20 [IU]/L (ref 0–40)
Albumin: 4.2 g/dL (ref 4.0–5.0)
Alkaline Phosphatase: 107 [IU]/L (ref 44–121)
BUN/Creatinine Ratio: 10 (ref 9–23)
BUN: 7 mg/dL (ref 6–20)
Bilirubin Total: 0.2 mg/dL (ref 0.0–1.2)
CO2: 21 mmol/L (ref 20–29)
Calcium: 9.1 mg/dL (ref 8.7–10.2)
Chloride: 104 mmol/L (ref 96–106)
Creatinine, Ser: 0.7 mg/dL (ref 0.57–1.00)
Globulin, Total: 3.3 g/dL (ref 1.5–4.5)
Glucose: 87 mg/dL (ref 70–99)
Potassium: 3.7 mmol/L (ref 3.5–5.2)
Sodium: 138 mmol/L (ref 134–144)
Total Protein: 7.5 g/dL (ref 6.0–8.5)
eGFR: 125 mL/min/{1.73_m2} (ref >59)

## 2022-10-26 LAB — LIPID PANEL
Chol/HDL Ratio: 4.4 {ratio} (ref 0.0–4.4)
Cholesterol, Total: 181 mg/dL (ref 100–199)
HDL: 41 mg/dL (ref >39)
LDL Chol Calc (NIH): 124 mg/dL — ABNORMAL HIGH (ref 0–99)
Triglycerides: 86 mg/dL (ref 0–149)
VLDL Cholesterol Cal: 16 mg/dL (ref 5–40)

## 2022-10-26 LAB — IRON,TIBC AND FERRITIN PANEL
Ferritin: 53 ng/mL (ref 15–150)
Iron Saturation: 13 % — ABNORMAL LOW (ref 15–55)
Iron: 39 ug/dL (ref 27–159)
Total Iron Binding Capacity: 306 ug/dL (ref 250–450)
UIBC: 267 ug/dL (ref 131–425)

## 2022-10-26 LAB — VITAMIN D 25 HYDROXY (VIT D DEFICIENCY, FRACTURES): Vit D, 25-Hydroxy: 9.3 ng/mL — ABNORMAL LOW (ref 30.0–100.0)

## 2022-10-26 LAB — TSH: TSH: 1.08 u[IU]/mL (ref 0.450–4.500)

## 2022-10-26 LAB — VITAMIN B12: Vitamin B-12: 653 pg/mL (ref 232–1245)

## 2022-10-26 LAB — HEMOGLOBIN A1C
Est. average glucose Bld gHb Est-mCnc: 126 mg/dL
Hgb A1c MFr Bld: 6 % — ABNORMAL HIGH (ref 4.8–5.6)

## 2022-11-08 ENCOUNTER — Ambulatory Visit: Payer: MEDICAID | Admitting: Family

## 2022-11-08 VITALS — BP 125/70 | HR 112 | Ht 61.0 in | Wt 201.6 lb

## 2022-11-08 DIAGNOSIS — D509 Iron deficiency anemia, unspecified: Secondary | ICD-10-CM | POA: Diagnosis not present

## 2022-11-08 DIAGNOSIS — E1165 Type 2 diabetes mellitus with hyperglycemia: Secondary | ICD-10-CM | POA: Diagnosis not present

## 2022-11-08 DIAGNOSIS — E559 Vitamin D deficiency, unspecified: Secondary | ICD-10-CM

## 2022-11-08 DIAGNOSIS — E66812 Obesity, class 2: Secondary | ICD-10-CM

## 2022-11-08 DIAGNOSIS — I159 Secondary hypertension, unspecified: Secondary | ICD-10-CM

## 2022-11-08 DIAGNOSIS — Z6835 Body mass index (BMI) 35.0-35.9, adult: Secondary | ICD-10-CM

## 2022-11-08 DIAGNOSIS — E782 Mixed hyperlipidemia: Secondary | ICD-10-CM

## 2022-11-08 MED ORDER — VITAMIN D (ERGOCALCIFEROL) 1.25 MG (50000 UNIT) PO CAPS: 50000.0000 [IU] | ORAL_CAPSULE | ORAL | 1 refills | Status: DC

## 2022-12-01 ENCOUNTER — Encounter: Payer: Self-pay | Admitting: Oncology

## 2022-12-02 ENCOUNTER — Encounter: Payer: Self-pay | Admitting: Oncology

## 2022-12-03 ENCOUNTER — Encounter: Payer: Self-pay | Admitting: Family

## 2022-12-08 ENCOUNTER — Encounter: Payer: Self-pay | Admitting: Oncology

## 2022-12-09 ENCOUNTER — Ambulatory Visit: Payer: MEDICAID | Admitting: Family

## 2022-12-09 ENCOUNTER — Encounter: Payer: Self-pay | Admitting: Family

## 2022-12-09 VITALS — BP 120/80 | HR 72 | Ht 61.0 in | Wt 196.2 lb

## 2022-12-09 DIAGNOSIS — E782 Mixed hyperlipidemia: Secondary | ICD-10-CM

## 2022-12-09 DIAGNOSIS — D509 Iron deficiency anemia, unspecified: Secondary | ICD-10-CM

## 2022-12-09 DIAGNOSIS — E1165 Type 2 diabetes mellitus with hyperglycemia: Secondary | ICD-10-CM

## 2022-12-09 DIAGNOSIS — I159 Secondary hypertension, unspecified: Secondary | ICD-10-CM

## 2022-12-09 DIAGNOSIS — E559 Vitamin D deficiency, unspecified: Secondary | ICD-10-CM

## 2022-12-09 DIAGNOSIS — E669 Obesity, unspecified: Secondary | ICD-10-CM | POA: Diagnosis not present

## 2022-12-09 LAB — GLUCOSE, POCT (MANUAL RESULT ENTRY): POC Glucose: 90 mg/dL (ref 70–99)

## 2022-12-09 MED ORDER — MOUNJARO 2.5 MG/0.5ML ~~LOC~~ SOAJ: 2.5000 mg | SUBCUTANEOUS | Status: DC

## 2022-12-09 MED ORDER — TRIAMCINOLONE ACETONIDE 0.1 % EX CREA
1.0000 | TOPICAL_CREAM | Freq: Two times a day (BID) | CUTANEOUS | 0 refills | Status: DC
Start: 2022-12-09 — End: 2023-12-01

## 2023-01-01 ENCOUNTER — Encounter: Payer: Self-pay | Admitting: Family

## 2023-01-01 DIAGNOSIS — E782 Mixed hyperlipidemia: Secondary | ICD-10-CM | POA: Insufficient documentation

## 2023-01-01 DIAGNOSIS — E559 Vitamin D deficiency, unspecified: Secondary | ICD-10-CM | POA: Insufficient documentation

## 2023-01-01 MED ORDER — PRAVASTATIN SODIUM 10 MG PO TABS
10.0000 mg | ORAL_TABLET | Freq: Every day | ORAL | 1 refills | Status: DC
Start: 2023-01-01 — End: 2023-12-04

## 2023-01-01 MED ORDER — LOSARTAN POTASSIUM 25 MG PO TABS: 12.5000 mg | ORAL_TABLET | Freq: Every day | ORAL | 1 refills | Status: AC

## 2023-01-01 NOTE — Assessment & Plan Note (Signed)
Checking labs today.  Will continue supplements as needed.  

## 2023-01-01 NOTE — Assessment & Plan Note (Signed)
Blood pressure well controlled with current medications.  Continue current therapy.  Will reassess at follow up.  

## 2023-01-01 NOTE — Assessment & Plan Note (Signed)
Adding Ozempic to patient's current treatment.  Will follow up in 1 month for recheck.   Reassess at follow up.

## 2023-01-01 NOTE — Assessment & Plan Note (Signed)
Adding Ozempic given diabetes.  Will adjust as needed based on results.  The patient is asked to make an attempt to improve diet and exercise patterns to aid in medical management of this problem. Addressed importance of increasing and maintaining water intake.

## 2023-01-01 NOTE — Assessment & Plan Note (Signed)
Pt. Has had Venofer infusions previously.  Will add iron supplement and see if we are able to control without this.   Reassess at follow up.

## 2023-01-01 NOTE — Assessment & Plan Note (Signed)
Checking labs today.  Continue current therapy for lipid control. Will modify as needed based on labwork results.  

## 2023-01-01 NOTE — Progress Notes (Signed)
Established Patient Office Visit  Subjective:  Patient ID: Tina Day, female    DOB: December 17, 2000  Age: 22 y.o. MRN: [id number]  Chief Complaint  Patient presents with   Follow-up    2 week    Pt. Here today for 2 week n/p follow up.   Had labs done at that time, so we will review in detail today.   Labs: Vitamin D very low, Iron saturation low, A1C still elevated, but well controlled at 6.0. Asks if there is something that we can do to help her with her weight as well as her diabetes.   No other concerns today.      No other concerns at this time.   Past Medical History:  Diagnosis Date   Asthma    Diabetes (HCC)    Hyperlipidemia    Hypertension    Scoliosis    Self-mutilation age [age] 06/30/2020    No past surgical history on file.  Social History   Socioeconomic History   Marital status: Single    Spouse name: Not on file   Number of children: Not on file   Years of education: Not on file   Highest education level: Not on file  Occupational History   Not on file  Tobacco Use   Smoking status: Never   Smokeless tobacco: Never  Vaping Use   Vaping status: Never Used  Substance and Sexual Activity   Alcohol use: Never   Drug use: Never   Sexual activity: Yes    Partners: Male    Birth control/protection: Condom, Implant  Other Topics Concern   Not on file  Social History Narrative   Not on file   Social Drivers of Health   Financial Resource Strain: Not on file  Food Insecurity: Not on file  Transportation Needs: Not on file  Physical Activity: Not on file  Stress: Not on file  Social Connections: Not on file  Intimate Partner Violence: Not on file    Family History  Problem Relation Age of Onset   Seizures Paternal Uncle    Diabetes Maternal Grandmother    Stroke Maternal Grandmother    Diabetes Maternal Grandfather    Cancer Paternal Grandmother    Stroke Paternal Grandmother    Cancer Paternal Grandfather    Thyroid disease  Mother    Lupus Mother     Allergies  Allergen Reactions   Penicillins Swelling    Review of Systems  All other systems reviewed and are negative.      Objective:   BP 125/70   Pulse (!) 112   Ht 5\' 1"  (1.549 m)   Wt 201 lb 9.6 oz (91.4 kg)   SpO2 98%   BMI 38.09 kg/m   Vitals:   11/08/22 1427  BP: 125/70  Pulse: (!) 112  Height: 5\' 1"  (1.549 m)  Weight: 201 lb 9.6 oz (91.4 kg)  SpO2: 98%  BMI (Calculated): 38.11    Physical Exam Vitals and nursing note reviewed.  Constitutional:      Appearance: Normal appearance. She is normal weight.  HENT:     Head: Normocephalic.  Eyes:     Extraocular Movements: Extraocular movements intact.     Conjunctiva/sclera: Conjunctivae normal.     Pupils: Pupils are equal, round, and reactive to light.  Cardiovascular:     Rate and Rhythm: Normal rate.  Pulmonary:     Effort: Pulmonary effort is normal.  Neurological:     General: No focal deficit present.  Mental Status: She is alert and oriented to person, place, and time. Mental status is at baseline.  Psychiatric:        Mood and Affect: Mood normal.        Behavior: Behavior normal.        Thought Content: Thought content normal.        Judgment: Judgment normal.     No results found for any visits on 11/08/22.  Recent Results (from the past 2160 hours)  POCT CBG (Fasting - Glucose)     Status: None   Collection Time: 10/25/22  2:18 PM  Result Value Ref Range   Glucose Fasting, POC 96 70 - 99 mg/dL  ACTH     Status: None   Collection Time: 10/25/22  2:57 PM  Result Value Ref Range   ACTH 10.1 7.2 - 63.3 pg/mL    Comment: ACTH reference interval for samples collected between 7 and 10 AM.  Lipid panel     Status: Abnormal   Collection Time: 10/25/22  2:57 PM  Result Value Ref Range   Cholesterol, Total 181 100 - 199 mg/dL   Triglycerides 86 0 - 149 mg/dL   HDL 41 >82 mg/dL   VLDL Cholesterol Cal 16 5 - 40 mg/dL   LDL Chol Calc (NIH) 956 (H) 0 - 99  mg/dL   Chol/HDL Ratio 4.4 0.0 - 4.4 ratio    Comment:                                   T. Chol/HDL Ratio                                             Men  Women                               1/2 Avg.Risk  3.4    3.3                                   Avg.Risk  5.0    4.4                                2X Avg.Risk  9.6    7.1                                3X Avg.Risk 23.4   11.0   VITAMIN D 25 Hydroxy (Vit-D Deficiency, Fractures)     Status: Abnormal   Collection Time: 10/25/22  2:57 PM  Result Value Ref Range   Vit D, 25-Hydroxy 9.3 (L) 30.0 - 100.0 ng/mL    Comment: Vitamin D deficiency has been defined by the Institute of Medicine and an Endocrine Society practice guideline as a level of serum 25-OH vitamin D less than 20 ng/mL (1,2). The Endocrine Society went on to further define vitamin D insufficiency as a level between 21 and 29 ng/mL (2). 1. IOM (Institute of Medicine). 2010. Dietary reference    intakes for calcium and D. [place]: The    [employer].  2. Holick MF, Binkley Leonard, Bischoff-Ferrari HA, et al.    Evaluation, treatment, and prevention of vitamin D    deficiency: an Endocrine Society clinical practice    guideline. JCEM. 2011 Jul; 96(7):1911-30.   CMP14+EGFR     Status: None   Collection Time: 10/25/22  2:57 PM  Result Value Ref Range   Glucose 87 70 - 99 mg/dL   BUN 7 6 - 20 mg/dL   Creatinine, Ser 1.61 0.57 - 1.00 mg/dL   eGFR 096 >04 VW/UJW/1.19   BUN/Creatinine Ratio 10 9 - 23   Sodium 138 134 - 144 mmol/L   Potassium 3.7 3.5 - 5.2 mmol/L   Chloride 104 96 - 106 mmol/L   CO2 21 20 - 29 mmol/L   Calcium 9.1 8.7 - 10.2 mg/dL   Total Protein 7.5 6.0 - 8.5 g/dL   Albumin 4.2 4.0 - 5.0 g/dL   Globulin, Total 3.3 1.5 - 4.5 g/dL   Bilirubin Total 0.2 0.0 - 1.2 mg/dL   Alkaline Phosphatase 107 44 - 121 IU/L   AST 20 0 - 40 IU/L   ALT 22 0 - 32 IU/L  TSH     Status: None   Collection Time: 10/25/22  2:57 PM  Result Value Ref Range    TSH 1.080 0.450 - 4.500 uIU/mL  Hemoglobin A1c     Status: Abnormal   Collection Time: 10/25/22  2:57 PM  Result Value Ref Range   Hgb A1c MFr Bld 6.0 (H) 4.8 - 5.6 %    Comment:          Prediabetes: 5.7 - 6.4          Diabetes: >6.4          Glycemic control for adults with diabetes: <7.0    Est. average glucose Bld gHb Est-mCnc 126 mg/dL  Vitamin J47     Status: None   Collection Time: 10/25/22  2:57 PM  Result Value Ref Range   Vitamin B-12 653 232 - 1,245 pg/mL  Iron, TIBC and Ferritin Panel     Status: Abnormal   Collection Time: 10/25/22  2:57 PM  Result Value Ref Range   Total Iron Binding Capacity 306 250 - 450 ug/dL   UIBC 829 562 - 130 ug/dL   Iron 39 27 - 865 ug/dL   Iron Saturation 13 (L) 15 - 55 %   Ferritin 53 15 - 150 ng/mL  POCT Glucose (CBG)     Status: Normal   Collection Time: 12/09/22  2:53 PM  Result Value Ref Range   POC Glucose 90 70 - 99 mg/dl       Assessment & Plan:   Problem List Items Addressed This Visit       Active Problems   Type 2 diabetes mellitus with hyperglycemia, without long-term current use of insulin (HCC) - Primary   Adding Ozempic to patient's current treatment.  Will follow up in 1 month for recheck.   Reassess at follow up.       Relevant Medications   losartan (COZAAR) 25 MG tablet   pravastatin (PRAVACHOL) 10 MG tablet   Hypertension   Blood pressure well controlled with current medications.  Continue current therapy.  Will reassess at follow up.       Relevant Medications   losartan (COZAAR) 25 MG tablet   pravastatin (PRAVACHOL) 10 MG tablet   Iron deficiency anemia   Pt. Has had Venofer infusions previously.  Will add iron supplement and see  if we are able to control without this.   Reassess at follow up.       Class 2 severe obesity due to excess calories with serious comorbidity and body mass index (BMI) of 35.0 to 35.9 in adult [facility])   Adding Ozempic given diabetes.  Will adjust as needed based on  results.  The patient is asked to make an attempt to improve diet and exercise patterns to aid in medical management of this problem. Addressed importance of increasing and maintaining water intake.        Vitamin D deficiency, unspecified   Checking labs today.  Will continue supplements as needed.       Mixed hyperlipidemia   Checking labs today.  Continue current therapy for lipid control. Will modify as needed based on labwork results.       Relevant Medications   losartan (COZAAR) 25 MG tablet   pravastatin (PRAVACHOL) 10 MG tablet    Return in about 1 month (around 12/09/2022) for F/U.   Total time spent: 30 minutes  Miki Kins, FNP  11/08/2022   This document may have been prepared by [facility] Voice Recognition software and as such may include unintentional dictation errors.

## 2023-01-12 ENCOUNTER — Ambulatory Visit: Payer: MEDICAID | Admitting: Family

## 2023-01-27 ENCOUNTER — Ambulatory Visit: Payer: MEDICAID | Admitting: Family

## 2023-02-06 ENCOUNTER — Ambulatory Visit: Payer: MEDICAID | Admitting: Family

## 2023-02-06 VITALS — BP 118/73 | HR 100 | Ht 61.0 in | Wt 191.0 lb

## 2023-02-06 DIAGNOSIS — E1165 Type 2 diabetes mellitus with hyperglycemia: Secondary | ICD-10-CM

## 2023-02-06 DIAGNOSIS — M25551 Pain in right hip: Secondary | ICD-10-CM | POA: Diagnosis not present

## 2023-02-06 DIAGNOSIS — K5900 Constipation, unspecified: Secondary | ICD-10-CM

## 2023-02-06 DIAGNOSIS — Z0130 Encounter for examination of blood pressure without abnormal findings: Secondary | ICD-10-CM

## 2023-02-06 LAB — POCT CBG (FASTING - GLUCOSE)-MANUAL ENTRY: Glucose Fasting, POC: 87 mg/dL (ref 70–99)

## 2023-02-06 MED ORDER — MOUNJARO 5 MG/0.5ML ~~LOC~~ SOAJ: 5.0000 mg | SUBCUTANEOUS | 3 refills | Status: AC

## 2023-02-06 MED ORDER — DOCUSATE SODIUM 100 MG PO CAPS: 100.0000 mg | ORAL_CAPSULE | Freq: Two times a day (BID) | ORAL | 1 refills | Status: DC | PRN

## 2023-02-06 NOTE — Progress Notes (Signed)
 Established Patient Office Visit  Subjective:  Patient ID: Tina Day, female    DOB: 11-Aug-2000  Age: 23 y.o. MRN: [id number]  Chief Complaint  Patient presents with   Follow-up    1 month follow up    Hip Pain    Right hip pain, no injury just started hurting    Constipation    X1 week- blood when wiping     Stool issues: Constipation Came out with blood in her stool.   Hip pain.  Started hurting last week,  Steps are particularly bad, but hurts with any weight bearing.   No pain if seated.  Lateral surface of hip is where pain is located.    Hip Pain  The incident occurred 5 to 7 days ago. There was no injury mechanism. The pain is present in the right hip. The quality of the pain is described as aching. The pain is moderate. The pain has been Intermittent since onset. Pertinent negatives include no inability to bear weight, loss of motion, loss of sensation, muscle weakness, numbness or tingling. She reports no foreign bodies present. The symptoms are aggravated by movement and weight bearing. She has tried acetaminophen, non-weight bearing, rest, ice and heat for the symptoms. The treatment provided mild relief.  Constipation This is a recurrent problem. The current episode started more than 1 year ago. The problem has been waxing and waning since onset. Her stool frequency is 2 to 3 times per week. The stool is described as blood tinged and firm. The patient is not on a high fiber diet. She Does not exercise regularly. There has Not been adequate water intake. Associated symptoms include abdominal pain. Risk factors include change in medication usage/dosage and obesity. She has tried diet changes and fiber for the symptoms. The treatment provided moderate relief. Her past medical history is significant for endocrine disease and psychiatric history.    No other concerns at this time.   Past Medical History:  Diagnosis Date   Asthma    Diabetes (HCC)    Hyperlipidemia     Hypertension    Scoliosis    Self-mutilation age 44-2017 06/30/2020    History reviewed. No pertinent surgical history.  Social History   Socioeconomic History   Marital status: Single    Spouse name: Not on file   Number of children: Not on file   Years of education: Not on file   Highest education level: Not on file  Occupational History   Not on file  Tobacco Use   Smoking status: Never   Smokeless tobacco: Never  Vaping Use   Vaping status: Never Used  Substance and Sexual Activity   Alcohol use: Never   Drug use: Never   Sexual activity: Yes    Partners: Male    Birth control/protection: Condom, Implant  Other Topics Concern   Not on file  Social History Narrative   Not on file   Social Drivers of Health   Financial Resource Strain: Not on file  Food Insecurity: Not on file  Transportation Needs: Not on file  Physical Activity: Not on file  Stress: Not on file  Social Connections: Not on file  Intimate Partner Violence: Not on file    Family History  Problem Relation Age of Onset   Seizures Paternal Uncle    Diabetes Maternal Grandmother    Stroke Maternal Grandmother    Diabetes Maternal Grandfather    Cancer Paternal Grandmother    Stroke Paternal Grandmother  Cancer Paternal Grandfather    Thyroid disease Mother    Lupus Mother     Allergies  Allergen Reactions   Penicillins Swelling    Review of Systems  Gastrointestinal:  Positive for abdominal pain and constipation.  Musculoskeletal:  Positive for joint pain.  Neurological:  Negative for tingling and numbness.  All other systems reviewed and are negative.      Objective:   BP 118/73   Pulse 100   Ht 5\' 1"  (1.549 m)   Wt 191 lb (86.6 kg)   SpO2 98%   BMI 36.09 kg/m   Vitals:   02/06/23 1434  BP: 118/73  Pulse: 100  Height: 5\' 1"  (1.549 m)  Weight: 191 lb (86.6 kg)  SpO2: 98%  BMI (Calculated): 36.11    Physical Exam Vitals and nursing note reviewed.   Constitutional:      General: She is awake.     Appearance: Normal appearance. She is well-developed and well-groomed. She is obese.  HENT:     Head: Normocephalic and atraumatic.  Eyes:     Extraocular Movements: Extraocular movements intact.     Conjunctiva/sclera: Conjunctivae normal.     Pupils: Pupils are equal, round, and reactive to light.  Cardiovascular:     Rate and Rhythm: Normal rate.  Pulmonary:     Effort: Pulmonary effort is normal.  Abdominal:     General: Bowel sounds are normal.  Musculoskeletal:        General: Normal range of motion.     Cervical back: Normal range of motion.  Neurological:     General: No focal deficit present.     Mental Status: She is alert and oriented to person, place, and time. Mental status is at baseline.  Psychiatric:        Mood and Affect: Mood normal.        Behavior: Behavior normal. Behavior is cooperative.        Thought Content: Thought content normal.        Judgment: Judgment normal.      Results for orders placed or performed in visit on 02/06/23  POCT CBG (Fasting - Glucose)  Result Value Ref Range   Glucose Fasting, POC 87 70 - 99 mg/dL    Recent Results (from the past 2160 hours)  POCT CBG (Fasting - Glucose)     Status: None   Collection Time: 02/06/23  2:42 PM  Result Value Ref Range   Glucose Fasting, POC 87 70 - 99 mg/dL  POCT Urinalysis Dipstick (16109)     Status: Abnormal   Collection Time: 04/03/23  2:12 PM  Result Value Ref Range   Color, UA Yellow    Clarity, UA Clear    Glucose, UA Negative Negative   Bilirubin, UA Negative    Ketones, UA Negative    Spec Grav, UA >=1.030 (A) 1.010 - 1.025   Blood, UA Negative    pH, UA 6.0 5.0 - 8.0   Protein, UA Negative Negative   Urobilinogen, UA 1.0 0.2 or 1.0 E.U./dL   Nitrite, UA Negative    Leukocytes, UA Negative Negative   Appearance Clear    Odor No   POC CREATINE & ALBUMIN,URINE     Status: Normal   Collection Time: 04/03/23  2:47 PM  Result  Value Ref Range   Microalbumin Ur, POC 30 mg/L   Creatinine, POC 300 mg/dL   Albumin/Creatinine Ratio, Urine, POC <30   NuSwab VG+, HSV     Status:  None   Collection Time: 04/03/23  3:46 PM  Result Value Ref Range   Atopobium vaginae Low - 0 Score   BVAB 2 Low - 0 Score   Megasphaera 1 Low - 0 Score    Comment: Calculate total score by adding the 3 individual bacterial vaginosis (BV) marker scores together.  Total score is interpreted as follows: Total score 0-1: Indicates the absence of BV. Total score   2: Indeterminate for BV. Additional clinical                  data should be evaluated to establish a                  diagnosis. Total score 3-6: Indicates the presence of BV.    Candida albicans, NAA Negative Negative   Candida glabrata, NAA Negative Negative   Trich vag by NAA Negative Negative   Chlamydia trachomatis, NAA Negative Negative   Neisseria gonorrhoeae, NAA Negative Negative   HSV 1 NAA Negative Negative   HSV 2 NAA Negative Negative       Assessment & Plan:   Problem List Items Addressed This Visit       Endocrine   Type 2 diabetes mellitus with hyperglycemia, without long-term current use of insulin (HCC) - Primary   Patient is doing well with the [facility], will send refills for the 5 mg dose.   Continue current other meds POC reviewed and agreed to.   Will reassess at regular follow up.       Relevant Medications   tirzepatide (MOUNJARO) 5 MG/0.5ML Pen   Other Relevant Orders   POCT CBG (Fasting - Glucose) (Completed)   Other Visit Diagnoses       Right hip pain       will set up for physical therapy if patient would like.  Reassess at follow up to see if this is still bothering her to send referral.     Constipation, unspecified constipation type       Sending RX for stool softeners for patient.  She will let me know if she continues to have issues.       Return in about 2 weeks (around 02/20/2023) for F/U.   Total time spent: 20  minutes  Miki Kins, FNP  02/06/2023   This document may have been prepared by [facility] Voice Recognition software and as such may include unintentional dictation errors.

## 2023-02-10 ENCOUNTER — Encounter: Payer: Self-pay | Admitting: Oncology

## 2023-02-10 ENCOUNTER — Other Ambulatory Visit: Payer: Self-pay | Admitting: Family

## 2023-02-10 ENCOUNTER — Ambulatory Visit
Admission: RE | Admit: 2023-02-10 | Discharge: 2023-02-10 | Disposition: A | Discharge status: Home / Self Care | Payer: MEDICAID | Source: Ambulatory Visit | Attending: Family | Admitting: Family

## 2023-02-10 ENCOUNTER — Ambulatory Visit
Admission: RE | Admit: 2023-02-10 | Discharge: 2023-02-10 | Disposition: A | Discharge status: Home / Self Care | Payer: MEDICAID | Attending: Family | Admitting: Family

## 2023-02-10 DIAGNOSIS — M25551 Pain in right hip: Secondary | ICD-10-CM | POA: Insufficient documentation

## 2023-02-10 DIAGNOSIS — K5900 Constipation, unspecified: Secondary | ICD-10-CM

## 2023-02-10 DIAGNOSIS — E1165 Type 2 diabetes mellitus with hyperglycemia: Secondary | ICD-10-CM

## 2023-02-19 ENCOUNTER — Encounter: Payer: Self-pay | Admitting: Family

## 2023-02-19 NOTE — Assessment & Plan Note (Signed)
 Checking labs today. Will call pt. With results  Continue current diabetes POC, as patient has been well controlled on current regimen.  Will adjust meds if needed based on labs.

## 2023-02-19 NOTE — Progress Notes (Signed)
Established Patient Office Visit  Subjective:  Patient ID: Tina Day, female    DOB: Apr 18, 2000  Age: 23 y.o. MRN: [id number]  Chief Complaint  Patient presents with   Follow-up    1 mo    Patient is here today for her 1 month follow up.  She has been feeling fairly well since last appointment.   She does have additional concerns to discuss today.  She says that the mounjaro has been better, no side effects that she has noticed.  Labs are due today. She needs refills.   I have reviewed her active problem list, medication list, allergies, notes from last encounter, lab results for her appointment today.      No other concerns at this time.   Past Medical History:  Diagnosis Date   Asthma    Diabetes (HCC)    Hyperlipidemia    Hypertension    Scoliosis    Self-mutilation age 37-2017 06/30/2020    History reviewed. No pertinent surgical history.  Social History   Socioeconomic History   Marital status: Single    Spouse name: Not on file   Number of children: Not on file   Years of education: Not on file   Highest education level: Not on file  Occupational History   Not on file  Tobacco Use   Smoking status: Never   Smokeless tobacco: Never  Vaping Use   Vaping status: Never Used  Substance and Sexual Activity   Alcohol use: Never   Drug use: Never   Sexual activity: Yes    Partners: Male    Birth control/protection: Condom, Implant  Other Topics Concern   Not on file  Social History Narrative   Not on file   Social Drivers of Health   Financial Resource Strain: Not on file  Food Insecurity: Not on file  Transportation Needs: Not on file  Physical Activity: Not on file  Stress: Not on file  Social Connections: Not on file  Intimate Partner Violence: Not on file    Family History  Problem Relation Age of Onset   Seizures Paternal Uncle    Diabetes Maternal Grandmother    Stroke Maternal Grandmother    Diabetes Maternal Grandfather     Cancer Paternal Grandmother    Stroke Paternal Grandmother    Cancer Paternal Grandfather    Thyroid disease Mother    Lupus Mother     Allergies  Allergen Reactions   Penicillins Swelling    Review of Systems  All other systems reviewed and are negative.      Objective:   BP 120/80   Pulse 72   Ht 5\' 1"  (1.549 m)   Wt 196 lb 3.2 oz (89 kg)   SpO2 100%   BMI 37.07 kg/m   Vitals:   12/09/22 1446  BP: 120/80  Pulse: 72  Height: 5\' 1"  (1.549 m)  Weight: 196 lb 3.2 oz (89 kg)  SpO2: 100%  BMI (Calculated): 37.09    Physical Exam Vitals and nursing note reviewed.  Constitutional:      Appearance: Normal appearance. She is normal weight.  HENT:     Head: Normocephalic.  Eyes:     Extraocular Movements: Extraocular movements intact.     Conjunctiva/sclera: Conjunctivae normal.     Pupils: Pupils are equal, round, and reactive to light.  Cardiovascular:     Rate and Rhythm: Normal rate.  Pulmonary:     Effort: Pulmonary effort is normal.  Neurological:  General: No focal deficit present.     Mental Status: She is alert and oriented to person, place, and time. Mental status is at baseline.  Psychiatric:        Mood and Affect: Mood normal.        Behavior: Behavior normal.        Thought Content: Thought content normal.        Judgment: Judgment normal.      Results for orders placed or performed in visit on 12/09/22  POCT Glucose (CBG)  Result Value Ref Range   POC Glucose 90 70 - 99 mg/dl    Recent Results (from the past 2160 hours)  POCT Glucose (CBG)     Status: Normal   Collection Time: 12/09/22  2:53 PM  Result Value Ref Range   POC Glucose 90 70 - 99 mg/dl  POCT CBG (Fasting - Glucose)     Status: None   Collection Time: 02/06/23  2:42 PM  Result Value Ref Range   Glucose Fasting, POC 87 70 - 99 mg/dL       Assessment & Plan:   Problem List Items Addressed This Visit       Cardiovascular and Mediastinum   Hypertension      Endocrine   Type 2 diabetes mellitus with hyperglycemia, without long-term current use of insulin (HCC) - Primary   Checking labs today. Will call pt. With results  Continue current diabetes POC, as patient has been well controlled on current regimen.  Will adjust meds if needed based on labs.        Relevant Orders   POCT Glucose (CBG) (Completed)     Other   Obesity (BMI 30-39.9) 195 lbs   Continue current meds.  Will adjust as needed based on results.  The patient is asked to make an attempt to improve diet and exercise patterns to aid in medical management of this problem. Addressed importance of increasing and maintaining water intake.        Iron deficiency anemia   Vitamin D deficiency, unspecified   Vitamin D was <20 Patient counseled that this could be affecting his energy level and bone health.   Sending supplementation for pt.  Will recheck at follow up.        Mixed hyperlipidemia   Continue current therapy for lipid control. Will modify as needed based on labwork results.         Return in about 3 months (around 03/11/2023).   Total time spent: 20 minutes  Miki Kins, FNP  12/09/2022   This document may have been prepared by [facility] Voice Recognition software and as such may include unintentional dictation errors. ?

## 2023-02-19 NOTE — Assessment & Plan Note (Signed)
 Continue current therapy for lipid control. Will modify as needed based on labwork results.

## 2023-02-19 NOTE — Assessment & Plan Note (Signed)
 Vitamin D was <20 Patient counseled that this could be affecting his energy level and bone health.   Sending supplementation for pt.  Will recheck at follow up.

## 2023-02-19 NOTE — Assessment & Plan Note (Signed)
 Continue current meds.  Will adjust as needed based on results.  The patient is asked to make an attempt to improve diet and exercise patterns to aid in medical management of this problem. Addressed importance of increasing and maintaining water intake.

## 2023-02-20 ENCOUNTER — Encounter: Payer: Self-pay | Admitting: Family

## 2023-02-20 ENCOUNTER — Ambulatory Visit: Payer: MEDICAID | Admitting: Family

## 2023-02-20 VITALS — BP 120/84 | HR 94 | Ht 61.0 in | Wt 194.4 lb

## 2023-02-20 DIAGNOSIS — F319 Bipolar disorder, unspecified: Secondary | ICD-10-CM | POA: Diagnosis not present

## 2023-02-20 DIAGNOSIS — E669 Obesity, unspecified: Secondary | ICD-10-CM | POA: Diagnosis not present

## 2023-02-20 DIAGNOSIS — Z6835 Body mass index (BMI) 35.0-35.9, adult: Secondary | ICD-10-CM

## 2023-02-20 DIAGNOSIS — E1165 Type 2 diabetes mellitus with hyperglycemia: Secondary | ICD-10-CM | POA: Diagnosis not present

## 2023-02-20 DIAGNOSIS — E66812 Obesity, class 2: Secondary | ICD-10-CM

## 2023-02-20 DIAGNOSIS — Z0130 Encounter for examination of blood pressure without abnormal findings: Secondary | ICD-10-CM

## 2023-02-20 DIAGNOSIS — M25551 Pain in right hip: Secondary | ICD-10-CM

## 2023-02-20 NOTE — Progress Notes (Signed)
 Established Patient Office Visit  Subjective:  Patient ID: Tina Day, female    DOB: 11-27-2000  Age: 23 y.o. MRN: [id number]  Chief Complaint  Patient presents with   Follow-up    2 week follow up    Patient is here today for her 2 week follow up.  She has been having pain in her right hip. She asks if there is anything we can do for this.  She says that she has been doing well otherwise. Medication changes have been helpful, not having any side effects.    No other concerns at this time.   Past Medical History:  Diagnosis Date   Asthma    Diabetes (HCC)    Hyperlipidemia    Hypertension    Scoliosis    Self-mutilation age 57-2017 06/30/2020    History reviewed. No pertinent surgical history.  Social History   Socioeconomic History   Marital status: Single    Spouse name: Not on file   Number of children: Not on file   Years of education: Not on file   Highest education level: Not on file  Occupational History   Not on file  Tobacco Use   Smoking status: Never   Smokeless tobacco: Never  Vaping Use   Vaping status: Never Used  Substance and Sexual Activity   Alcohol use: Never   Drug use: Never   Sexual activity: Yes    Partners: Male    Birth control/protection: Condom, Implant  Other Topics Concern   Not on file  Social History Narrative   Not on file   Social Drivers of Health   Financial Resource Strain: Not on file  Food Insecurity: Not on file  Transportation Needs: Not on file  Physical Activity: Not on file  Stress: Not on file  Social Connections: Not on file  Intimate Partner Violence: Not on file    Family History  Problem Relation Age of Onset   Seizures Paternal Uncle    Diabetes Maternal Grandmother    Stroke Maternal Grandmother    Diabetes Maternal Grandfather    Cancer Paternal Grandmother    Stroke Paternal Grandmother    Cancer Paternal Grandfather    Thyroid disease Mother    Lupus Mother     Allergies   Allergen Reactions   Penicillins Swelling    Review of Systems  Musculoskeletal:  Positive for joint pain.  All other systems reviewed and are negative.      Objective:   BP 120/84   Pulse 94   Ht 5\' 1"  (1.549 m)   Wt 194 lb 6.4 oz (88.2 kg)   SpO2 97%   BMI 36.73 kg/m   Vitals:   02/20/23 1458  BP: 120/84  Pulse: 94  Height: 5\' 1"  (1.549 m)  Weight: 194 lb 6.4 oz (88.2 kg)  SpO2: 97%  BMI (Calculated): 36.75    Physical Exam Vitals and nursing note reviewed.  Constitutional:      Appearance: Normal appearance. She is normal weight.  HENT:     Head: Normocephalic.  Eyes:     Extraocular Movements: Extraocular movements intact.     Conjunctiva/sclera: Conjunctivae normal.     Pupils: Pupils are equal, round, and reactive to light.  Cardiovascular:     Rate and Rhythm: Normal rate.  Pulmonary:     Effort: Pulmonary effort is normal.  Musculoskeletal:        General: Normal range of motion.  Neurological:     General: No focal  deficit present.     Mental Status: She is alert and oriented to person, place, and time. Mental status is at baseline.  Psychiatric:        Mood and Affect: Mood normal.        Behavior: Behavior normal.        Thought Content: Thought content normal.        Judgment: Judgment normal.      No results found for any visits on 02/20/23.  Recent Results (from the past 2160 hours)  POCT CBG (Fasting - Glucose)     Status: None   Collection Time: 02/06/23  2:42 PM  Result Value Ref Range   Glucose Fasting, POC 87 70 - 99 mg/dL  POCT Urinalysis Dipstick (16109)     Status: Abnormal   Collection Time: 04/03/23  2:12 PM  Result Value Ref Range   Color, UA Yellow    Clarity, UA Clear    Glucose, UA Negative Negative   Bilirubin, UA Negative    Ketones, UA Negative    Spec Grav, UA >=1.030 (A) 1.010 - 1.025   Blood, UA Negative    pH, UA 6.0 5.0 - 8.0   Protein, UA Negative Negative   Urobilinogen, UA 1.0 0.2 or 1.0 E.U./dL    Nitrite, UA Negative    Leukocytes, UA Negative Negative   Appearance Clear    Odor No   POC CREATINE & ALBUMIN,URINE     Status: Normal   Collection Time: 04/03/23  2:47 PM  Result Value Ref Range   Microalbumin Ur, POC 30 mg/L   Creatinine, POC 300 mg/dL   Albumin/Creatinine Ratio, Urine, POC <30   NuSwab VG+, HSV     Status: None   Collection Time: 04/03/23  3:46 PM  Result Value Ref Range   Atopobium vaginae Low - 0 Score   BVAB 2 Low - 0 Score   Megasphaera 1 Low - 0 Score    Comment: Calculate total score by adding the 3 individual bacterial vaginosis (BV) marker scores together.  Total score is interpreted as follows: Total score 0-1: Indicates the absence of BV. Total score   2: Indeterminate for BV. Additional clinical                  data should be evaluated to establish a                  diagnosis. Total score 3-6: Indicates the presence of BV.    Candida albicans, NAA Negative Negative   Candida glabrata, NAA Negative Negative   Trich vag by NAA Negative Negative   Chlamydia trachomatis, NAA Negative Negative   Neisseria gonorrhoeae, NAA Negative Negative   HSV 1 NAA Negative Negative   HSV 2 NAA Negative Negative       Assessment & Plan:   Problem List Items Addressed This Visit       Endocrine   Type 2 diabetes mellitus with hyperglycemia, without long-term current use of insulin (HCC)   Patient is doing well with the [facility], will send refills for the 5 mg dose.   Continue current other meds POC reviewed and agreed to.   Will reassess at regular follow up.         Other   Obesity (BMI 30-39.9) 195 lbs   Continue current meds.  Will adjust as needed based on results.  The patient is asked to make an attempt to improve diet and exercise patterns to aid in medical  management of this problem. Addressed importance of increasing and maintaining water intake.        Bipolar 1 disorder (HCC) dx'd 2021   Patient stable.  Well controlled with  current therapy.   Continue current meds.        Class 2 severe obesity due to excess calories with serious comorbidity and body mass index (BMI) of 35.0 to 35.9 in adult [facility])   Continue current meds.  Will adjust as needed based on results.  The patient is asked to make an attempt to improve diet and exercise patterns to aid in medical management of this problem. Addressed importance of increasing and maintaining water intake.        Other Visit Diagnoses       Right hip pain    -  Primary   setting up for physical therapy.  Will reassess at follow up.   Relevant Orders   Ambulatory referral to Physical Therapy       Return in about 1 month (around 03/20/2023).   Total time spent: 20 minutes  Miki Kins, FNP  02/20/2023   This document may have been prepared by [facility] Voice Recognition software and as such may include unintentional dictation errors.

## 2023-04-03 ENCOUNTER — Ambulatory Visit: Payer: MEDICAID | Admitting: Family

## 2023-04-03 ENCOUNTER — Encounter: Payer: Self-pay | Admitting: Family

## 2023-04-03 VITALS — BP 110/60 | HR 93 | Ht 61.0 in | Wt 199.2 lb

## 2023-04-03 DIAGNOSIS — R300 Dysuria: Secondary | ICD-10-CM

## 2023-04-03 DIAGNOSIS — Z202 Contact with and (suspected) exposure to infections with a predominantly sexual mode of transmission: Secondary | ICD-10-CM | POA: Diagnosis not present

## 2023-04-03 DIAGNOSIS — Z3046 Encounter for surveillance of implantable subdermal contraceptive: Secondary | ICD-10-CM | POA: Diagnosis not present

## 2023-04-03 DIAGNOSIS — Z0130 Encounter for examination of blood pressure without abnormal findings: Secondary | ICD-10-CM

## 2023-04-03 DIAGNOSIS — E1165 Type 2 diabetes mellitus with hyperglycemia: Secondary | ICD-10-CM

## 2023-04-03 LAB — POCT URINALYSIS DIPSTICK
Bilirubin, UA: NEGATIVE
Blood, UA: NEGATIVE
Glucose, UA: NEGATIVE
Ketones, UA: NEGATIVE
Leukocytes, UA: NEGATIVE
Nitrite, UA: NEGATIVE
Protein, UA: NEGATIVE
Spec Grav, UA: >=1.03 — AB (ref 1.010–1.025)
Urobilinogen, UA: 1 U/dL
pH, UA: 6 (ref 5.0–8.0)

## 2023-04-03 LAB — POC CREATINE & ALBUMIN,URINE
Albumin/Creatinine Ratio, Urine, POC: <30
Creatinine, POC: 300 mg/dL
Microalbumin Ur, POC: 30 mg/L

## 2023-04-06 LAB — NUSWAB VG+, HSV
Candida albicans, NAA: NEGATIVE
Candida glabrata, NAA: NEGATIVE
Chlamydia trachomatis, NAA: NEGATIVE
HSV 1 NAA: NEGATIVE
HSV 2 NAA: NEGATIVE
Neisseria gonorrhoeae, NAA: NEGATIVE
Trich vag by NAA: NEGATIVE

## 2023-04-09 ENCOUNTER — Encounter: Payer: Self-pay | Admitting: Family

## 2023-04-09 NOTE — Assessment & Plan Note (Signed)
 Patient is doing well with the [facility], will send refills for the 5 mg dose.   Continue current other meds POC reviewed and agreed to.   Will reassess at regular follow up.

## 2023-04-22 NOTE — Assessment & Plan Note (Signed)
 Continue current meds.  Will adjust as needed based on results.  The patient is asked to make an attempt to improve diet and exercise patterns to aid in medical management of this problem. Addressed importance of increasing and maintaining water intake.

## 2023-04-22 NOTE — Assessment & Plan Note (Signed)
 Patient stable.  Well controlled with current therapy.   Continue current meds.

## 2023-04-22 NOTE — Assessment & Plan Note (Signed)
 Patient is doing well with the [facility], will send refills for the 5 mg dose.   Continue current other meds POC reviewed and agreed to.   Will reassess at regular follow up.

## 2023-05-08 ENCOUNTER — Ambulatory Visit (INDEPENDENT_AMBULATORY_CARE_PROVIDER_SITE_OTHER): Payer: MEDICAID | Admitting: Certified Nurse Midwife

## 2023-05-08 ENCOUNTER — Encounter: Payer: Self-pay | Admitting: Certified Nurse Midwife

## 2023-05-08 VITALS — BP 117/72 | HR 94 | Wt 197.4 lb

## 2023-05-08 DIAGNOSIS — Z3046 Encounter for surveillance of implantable subdermal contraceptive: Secondary | ICD-10-CM

## 2023-05-08 MED ORDER — ETONOGESTREL 68 MG ~~LOC~~ IMPL
68.0000 mg | DRUG_IMPLANT | Freq: Once | SUBCUTANEOUS | Status: AC
  Administered 2023-05-08: 68 mg via SUBCUTANEOUS

## 2023-05-08 NOTE — Addendum Note (Signed)
 Addended by: Venetta Gill on: 05/08/2023 04:02 PM   Modules accepted: Orders

## 2023-05-08 NOTE — Patient Instructions (Signed)
 Nexplanon Instructions After Insertion  Keep bandage clean and dry for 24 hours  May use ice/Tylenol/Ibuprofen for soreness or pain  If you develop fever, drainage or increased warmth from incision site-contact office immediately

## 2023-05-08 NOTE — Progress Notes (Signed)
 Tina Day is a 23 y.o. year old [id number] African American female here for Nexplanon  removal and reinsertion.  She was given informed consent for removal and reinsertion of her Nexplanon . Her Nexplanon  was placed 06/30/2020, No LMP recorded. Patient has had an implant.,  Risks/benefits/side effects of Nexplanon  have been discussed and her questions have been answered.  Specifically, a failure rate of 01/998 has been reported, with an increased failure rate if pt takes St. John's Wort and/or antiseizure medicaitons.  [name] is aware of the common side effect of irregular bleeding, which the incidence of decreases over time.  BP 117/72   Pulse 94   Wt 197 lb 6.4 oz (89.5 kg)   BMI 37.30 kg/m  No LMP recorded. Patient has had an implant. No results found for this or any previous visit (from the past 24 hours).   Appropriate time out taken. Nexplanon  site identified.  Area prepped in usual sterile fashon. Two cc's of 2% lidocaine  was used to anesthetize the area. A small stab incision was made right beside the implant on the distal portion.  The Nexplanon  rod was grasped using hemostats and removed intact without difficulty.  The area was cleansed again with betadine and the Nexplanon  was inserted per manufacturer's recommendations without difficulty.  Steri-strips and a pressure bandage was applied.  There was less than 3 cc blood loss. There were no complications.  The patient tolerated the procedure well.  She was instructed to keep the area clean and dry, remove pressure bandage in 24 hours, and keep insertion site covered with the steri-strips for 3-5 days.  She was given a card indicating date Nexplanon  was inserted and date it needs to be removed.   Follow-up PRN problems.  Alise Appl, CNM

## 2023-06-05 ENCOUNTER — Ambulatory Visit: Payer: MEDICAID | Admitting: Family

## 2023-07-19 ENCOUNTER — Encounter: Payer: Self-pay | Admitting: Oncology

## 2023-08-12 ENCOUNTER — Encounter: Payer: Self-pay | Admitting: Family

## 2023-08-12 NOTE — Assessment & Plan Note (Signed)
 A/C ratio checked, WNL.  Patient stable.  Well controlled with current therapy.   Continue current meds.

## 2023-08-12 NOTE — Progress Notes (Signed)
 Established Patient Office Visit  Subjective:  Patient ID: Tina Day, female    DOB: 2000/12/11  Age: 23 y.o. MRN: [id number]  Chief Complaint  Patient presents with   Follow-up    6 week follow up    Patient is here today for her 6 week f/u.  She has been having some vaginal discharge and dysuria, asks if we can check her for UTI and STIs Also asks if she can get a referral to Ob/Gyn so that she can get her nexplanon  removed.     No other concerns at this time.   Past Medical History:  Diagnosis Date   Asthma    Diabetes (HCC)    Hyperlipidemia    Hypertension    Scoliosis    Self-mutilation age [age] 06/30/2020    No past surgical history on file.  Social History   Socioeconomic History   Marital status: Single    Spouse name: Not on file   Number of children: Not on file   Years of education: Not on file   Highest education level: Not on file  Occupational History   Not on file  Tobacco Use   Smoking status: Never   Smokeless tobacco: Never  Vaping Use   Vaping status: Never Used  Substance and Sexual Activity   Alcohol use: Never   Drug use: Never   Sexual activity: Yes    Partners: Male    Birth control/protection: Condom, Implant  Other Topics Concern   Not on file  Social History Narrative   Not on file   Social Drivers of Health   Financial Resource Strain: Not on file  Food Insecurity: Not on file  Transportation Needs: Not on file  Physical Activity: Not on file  Stress: Not on file  Social Connections: Not on file  Intimate Partner Violence: Not on file    Family History  Problem Relation Age of Onset   Seizures Paternal Uncle    Diabetes Maternal Grandmother    Stroke Maternal Grandmother    Diabetes Maternal Grandfather    Cancer Paternal Grandmother    Stroke Paternal Grandmother    Cancer Paternal Grandfather    Thyroid disease Mother    Lupus Mother     Allergies  Allergen Reactions   Penicillins Swelling     Review of Systems  Genitourinary:  Positive for dysuria, frequency and urgency.  All other systems reviewed and are negative.      Objective:   BP 110/60   Pulse 93   Ht 5' 1 (1.549 m)   Wt 199 lb 3.2 oz (90.4 kg)   SpO2 98%   BMI 37.64 kg/m   Vitals:   04/03/23 1359  BP: 110/60  Pulse: 93  Height: 5' 1 (1.549 m)  Weight: 199 lb 3.2 oz (90.4 kg)  SpO2: 98%  BMI (Calculated): 37.66    Physical Exam Vitals and nursing note reviewed.  Constitutional:      Appearance: Normal appearance. She is normal weight.  HENT:     Head: Normocephalic.  Eyes:     Extraocular Movements: Extraocular movements intact.     Conjunctiva/sclera: Conjunctivae normal.     Pupils: Pupils are equal, round, and reactive to light.  Cardiovascular:     Rate and Rhythm: Normal rate.  Pulmonary:     Effort: Pulmonary effort is normal.  Neurological:     General: No focal deficit present.     Mental Status: She is alert and oriented to person,  place, and time. Mental status is at baseline.  Psychiatric:        Mood and Affect: Mood normal.        Behavior: Behavior normal.        Thought Content: Thought content normal.        Judgment: Judgment normal.      Results for orders placed or performed in visit on 04/03/23  POCT Urinalysis Dipstick (81002)  Result Value Ref Range   Color, UA Yellow    Clarity, UA Clear    Glucose, UA Negative Negative   Bilirubin, UA Negative    Ketones, UA Negative    Spec Grav, UA >=1.030 (A) 1.010 - 1.025   Blood, UA Negative    pH, UA 6.0 5.0 - 8.0   Protein, UA Negative Negative   Urobilinogen, UA 1.0 0.2 or 1.0 E.U./dL   Nitrite, UA Negative    Leukocytes, UA Negative Negative   Appearance Clear    Odor No   POC CREATINE & ALBUMIN,URINE  Result Value Ref Range   Microalbumin Ur, POC 30 mg/L   Creatinine, POC 300 mg/dL   Albumin/Creatinine Ratio, Urine, POC <30   NuSwab VG+, HSV  Result Value Ref Range   Atopobium vaginae Low - 0  Score   BVAB 2 Low - 0 Score   Megasphaera 1 Low - 0 Score   Candida albicans, NAA Negative Negative   Candida glabrata, NAA Negative Negative   Trich vag by NAA Negative Negative   Chlamydia trachomatis, NAA Negative Negative   Neisseria gonorrhoeae, NAA Negative Negative   HSV 1 NAA Negative Negative   HSV 2 NAA Negative Negative    No results found for this or any previous visit (from the past 2160 hours).     Assessment & Plan Possible exposure to STI Nuswab sent to lab today.  Will call pt with results when available.   Dysuria UA in office WNL.   Encounter for removal of subdermal contraceptive implant Setting patient up for referral to GYN .  Will defer to them for further treatment changes.  Reassess at follow up.  Type 2 diabetes mellitus with hyperglycemia, without long-term current use of insulin (HCC) A/C ratio checked, WNL.  Patient stable.  Well controlled with current therapy.   Continue current meds.      Return in about 1 month (around 05/04/2023) for F/U.   Total time spent: 20 minutes  ALAN CHRISTELLA ARRANT, FNP  04/03/2023   This document may have been prepared by [facility] Voice Recognition software and as such may include unintentional dictation errors.

## 2023-12-01 ENCOUNTER — Ambulatory Visit: Payer: Self-pay | Admitting: Internal Medicine

## 2023-12-01 ENCOUNTER — Ambulatory Visit: Payer: MEDICAID | Admitting: Internal Medicine

## 2023-12-01 ENCOUNTER — Encounter: Payer: Self-pay | Admitting: Internal Medicine

## 2023-12-01 VITALS — BP 112/72 | HR 99 | Ht 61.0 in | Wt 198.4 lb

## 2023-12-01 DIAGNOSIS — N911 Secondary amenorrhea: Secondary | ICD-10-CM | POA: Diagnosis not present

## 2023-12-01 DIAGNOSIS — E1165 Type 2 diabetes mellitus with hyperglycemia: Secondary | ICD-10-CM

## 2023-12-01 DIAGNOSIS — I159 Secondary hypertension, unspecified: Secondary | ICD-10-CM

## 2023-12-01 DIAGNOSIS — R5383 Other fatigue: Secondary | ICD-10-CM

## 2023-12-01 DIAGNOSIS — E559 Vitamin D deficiency, unspecified: Secondary | ICD-10-CM

## 2023-12-01 DIAGNOSIS — R300 Dysuria: Secondary | ICD-10-CM | POA: Diagnosis not present

## 2023-12-01 DIAGNOSIS — E538 Deficiency of other specified B group vitamins: Secondary | ICD-10-CM

## 2023-12-01 DIAGNOSIS — K5909 Other constipation: Secondary | ICD-10-CM | POA: Insufficient documentation

## 2023-12-01 DIAGNOSIS — F319 Bipolar disorder, unspecified: Secondary | ICD-10-CM | POA: Diagnosis not present

## 2023-12-01 DIAGNOSIS — Z6837 Body mass index (BMI) 37.0-37.9, adult: Secondary | ICD-10-CM

## 2023-12-01 DIAGNOSIS — E66812 Obesity, class 2: Secondary | ICD-10-CM | POA: Diagnosis not present

## 2023-12-01 DIAGNOSIS — E782 Mixed hyperlipidemia: Secondary | ICD-10-CM

## 2023-12-01 DIAGNOSIS — D509 Iron deficiency anemia, unspecified: Secondary | ICD-10-CM

## 2023-12-01 DIAGNOSIS — E6609 Other obesity due to excess calories: Secondary | ICD-10-CM

## 2023-12-01 LAB — POCT URINALYSIS DIPSTICK
Bilirubin, UA: NEGATIVE
Blood, UA: NEGATIVE
Glucose, UA: NEGATIVE
Ketones, UA: NEGATIVE
Leukocytes, UA: NEGATIVE
Nitrite, UA: NEGATIVE
Protein, UA: POSITIVE — AB
Spec Grav, UA: >=1.03 — AB (ref 1.010–1.025)
Urobilinogen, UA: 1 U/dL
pH, UA: 5.5 (ref 5.0–8.0)

## 2023-12-01 LAB — POCT URINE PREGNANCY: Preg Test, Ur: NEGATIVE

## 2023-12-01 LAB — POCT CBG (FASTING - GLUCOSE)-MANUAL ENTRY: Glucose Fasting, POC: 104 mg/dL — AB (ref 70–99)

## 2023-12-01 MED ORDER — NITROFURANTOIN MONOHYD MACRO 100 MG PO CAPS: 100.0000 mg | ORAL_CAPSULE | Freq: Two times a day (BID) | ORAL | 0 refills | Status: AC

## 2023-12-01 MED ORDER — POLYETHYLENE GLYCOL 3350 17 GM/SCOOP PO POWD: 17.0000 g | Freq: Every day | ORAL | 0 refills | Status: AC

## 2023-12-01 NOTE — Progress Notes (Signed)
 Established Patient Office Visit  Subjective:  Patient ID: Tina Day, female    DOB: 09/14/2000  Age: 23 y.o. MRN: [id number]  Chief Complaint  Patient presents with   Dysuria    She is here today to check for UTI. Patient reports dysuria, malodor, lower abdominal pain, and increased urinary frequency. UA was unremarkable. Will send for culture. She has Nexplanon  implant for birth control . She reports not having menstrual cycles and when she does it is old blood and describes it as spotting. She is awaiting to see OBGYN to confirm if it is related to her birth control or if she has PCOS. She has not been sexually active in quite some time. Pregnancy test today was negative. Will send in Macrobid twice daily empirically while we await culture results.  Patient also reports chronic constipation. She takes colace daily and goes 3-4 days without bowel movement. She has never seen GI for constipation or had any imaging performed. Recommend patient switch to Miralax once daily until having regular bowel movements and then reduce use to as needed. Reinforced healthy diet and drinking plenty of water daily to maintain adequate hydration. Discussed probiotics and fiber supplementation as well.  Patient is due for routine labs today.    Dysuria  Associated symptoms include frequency and urgency. Pertinent negatives include no chills, flank pain, nausea or vomiting.    No other concerns at this time.   Past Medical History:  Diagnosis Date   Asthma    Diabetes (HCC)    Hyperlipidemia    Hypertension    Scoliosis    Self-mutilation age 74-2017 06/30/2020    No past surgical history on file.  Social History   Socioeconomic History   Marital status: Single    Spouse name: Not on file   Number of children: Not on file   Years of education: Not on file   Highest education level: Not on file  Occupational History   Not on file  Tobacco Use   Smoking status: Never   Smokeless  tobacco: Never  Vaping Use   Vaping status: Never Used  Substance and Sexual Activity   Alcohol use: Never   Drug use: Never   Sexual activity: Yes    Partners: Male    Birth control/protection: Condom, Implant  Other Topics Concern   Not on file  Social History Narrative   Not on file   Social Drivers of Health   Financial Resource Strain: Not on file  Food Insecurity: Not on file  Transportation Needs: Not on file  Physical Activity: Not on file  Stress: Not on file  Social Connections: Not on file  Intimate Partner Violence: Not on file    Family History  Problem Relation Age of Onset   Seizures Paternal Uncle    Diabetes Maternal Grandmother    Stroke Maternal Grandmother    Diabetes Maternal Grandfather    Cancer Paternal Grandmother    Stroke Paternal Grandmother    Cancer Paternal Grandfather    Thyroid disease Mother    Lupus Mother     Allergies  Allergen Reactions   Penicillins Swelling    Outpatient Medications Prior to Visit  Medication Sig   Acetaminophen (TYLENOL PO) Take by mouth.   docusate sodium  (COLACE) 100 MG capsule Take 1 capsule (100 mg total) by mouth 2 (two) times daily as needed for mild constipation or moderate constipation.   empagliflozin (JARDIANCE) 10 MG TABS tablet Take by mouth daily.   losartan  (  COZAAR ) 25 MG tablet Take 0.5 tablets (12.5 mg total) by mouth daily.   pravastatin  (PRAVACHOL ) 10 MG tablet Take 1 tablet (10 mg total) by mouth daily.   tirzepatide  (MOUNJARO ) 5 MG/0.5ML Pen Inject 5 mg into the skin once a week.   Vitamin D , Ergocalciferol , (DRISDOL ) 1.25 MG (50000 UNIT) CAPS capsule Take 1 capsule (50,000 Units total) by mouth every 7 (seven) days.   triamcinolone  cream (KENALOG ) 0.1 % Apply 1 Application topically 2 (two) times daily. (Patient not taking: Reported on 12/01/2023)   No facility-administered medications prior to visit.    Review of Systems  Constitutional: Negative.  Negative for chills, fever and  malaise/fatigue.  HENT: Negative.  Negative for congestion and sore throat.   Eyes: Negative.  Negative for blurred vision and pain.  Respiratory: Negative.  Negative for cough and shortness of breath.   Cardiovascular: Negative.  Negative for chest pain, palpitations and leg swelling.  Gastrointestinal:  Positive for abdominal pain (lower abdomenal) and constipation (lower abdominal). Negative for blood in stool, diarrhea, heartburn, melena, nausea and vomiting.  Genitourinary:  Positive for dysuria, frequency and urgency. Negative for flank pain.       Malodor.   Musculoskeletal: Negative.  Negative for joint pain and myalgias.  Skin: Negative.   Neurological: Negative.  Negative for dizziness, tingling, sensory change, weakness and headaches.  Endo/Heme/Allergies: Negative.   Psychiatric/Behavioral: Negative.  Negative for depression and suicidal ideas. The patient is not nervous/anxious.        Objective:   BP 112/72   Pulse 99   Ht 5' 1 (1.549 m)   Wt 198 lb 6.4 oz (90 kg)   SpO2 98%   BMI 37.49 kg/m   Vitals:   12/01/23 1456  BP: 112/72  Pulse: 99  Height: 5' 1 (1.549 m)  Weight: 198 lb 6.4 oz (90 kg)  SpO2: 98%  BMI (Calculated): 37.51    Physical Exam Vitals and nursing note reviewed.  Constitutional:      Appearance: Normal appearance.  HENT:     Head: Normocephalic and atraumatic.     Nose: Nose normal.     Mouth/Throat:     Mouth: Mucous membranes are moist.     Pharynx: Oropharynx is clear.  Eyes:     Conjunctiva/sclera: Conjunctivae normal.     Pupils: Pupils are equal, round, and reactive to light.  Cardiovascular:     Rate and Rhythm: Normal rate and regular rhythm.     Pulses: Normal pulses.     Heart sounds: Normal heart sounds. No murmur heard. Pulmonary:     Effort: Pulmonary effort is normal.     Breath sounds: Normal breath sounds. No wheezing.  Abdominal:     General: Bowel sounds are normal.     Palpations: Abdomen is soft.      Tenderness: There is no abdominal tenderness. There is no right CVA tenderness or left CVA tenderness.  Musculoskeletal:        General: Normal range of motion.     Cervical back: Normal range of motion.     Right lower leg: No edema.     Left lower leg: No edema.  Skin:    General: Skin is warm and dry.  Neurological:     General: No focal deficit present.     Mental Status: She is alert and oriented to person, place, and time.  Psychiatric:        Mood and Affect: Mood normal.  Behavior: Behavior normal.      Results for orders placed or performed in visit on 12/01/23  POCT Urinalysis Dipstick (81002)  Result Value Ref Range   Color, UA Orange    Clarity, UA Cloudy    Glucose, UA Negative Negative   Bilirubin, UA Negative    Ketones, UA Negative    Spec Grav, UA >=1.030 (A) 1.010 - 1.025   Blood, UA Negative    pH, UA 5.5 5.0 - 8.0   Protein, UA Positive (A) Negative   Urobilinogen, UA 1.0 0.2 or 1.0 E.U./dL   Nitrite, UA Negative    Leukocytes, UA Negative Negative   Appearance Cloudy    Odor Yes   POCT CBG (Fasting - Glucose)  Result Value Ref Range   Glucose Fasting, POC 104 (A) 70 - 99 mg/dL  POCT Urine Pregnancy  Result Value Ref Range   Preg Test, Ur Negative Negative    Recent Results (from the past 2160 hours)  POCT Urinalysis Dipstick (18997)     Status: Abnormal   Collection Time: 12/01/23  2:41 PM  Result Value Ref Range   Color, UA Orange    Clarity, UA Cloudy    Glucose, UA Negative Negative   Bilirubin, UA Negative    Ketones, UA Negative    Spec Grav, UA >=1.030 (A) 1.010 - 1.025   Blood, UA Negative    pH, UA 5.5 5.0 - 8.0   Protein, UA Positive (A) Negative   Urobilinogen, UA 1.0 0.2 or 1.0 E.U./dL   Nitrite, UA Negative    Leukocytes, UA Negative Negative   Appearance Cloudy    Odor Yes   POCT CBG (Fasting - Glucose)     Status: Abnormal   Collection Time: 12/01/23  3:07 PM  Result Value Ref Range   Glucose Fasting, POC 104 (A)  70 - 99 mg/dL  POCT Urine Pregnancy     Status: None   Collection Time: 12/01/23  3:28 PM  Result Value Ref Range   Preg Test, Ur Negative Negative      Assessment & Plan:  Check routine blood work today and UA. UA was unremarkable but will send for culture. Start Empiric Macrobid twice day. Start Miralax for constipation. Recommend FU with OBGYN. Reinforced healthy diet and exercise as tolerated. Problem List Items Addressed This Visit     Type 2 diabetes mellitus with hyperglycemia, without long-term current use of insulin (HCC)   Relevant Orders   POCT CBG (Fasting - Glucose) (Completed)   Hemoglobin A1c   Hypertension   Relevant Orders   CMP14+EGFR   Bipolar 1 disorder (HCC) dx'd 2021   Relevant Orders   CMP14+EGFR   Iron deficiency anemia   Relevant Orders   CMP14+EGFR   Vitamin D  deficiency, unspecified   Relevant Orders   VITAMIN D  25 Hydroxy (Vit-D Deficiency, Fractures)   Mixed hyperlipidemia   Relevant Orders   CMP14+EGFR   Lipid panel   Dysuria - Primary   Relevant Medications   nitrofurantoin, macrocrystal-monohydrate, (MACROBID) 100 MG capsule   Other Relevant Orders   POCT Urinalysis Dipstick (18997) (Completed)   Urine Culture   Morbid obesity (HCC)   Secondary amenorrhea   Relevant Orders   CMP14+EGFR   POCT Urine Pregnancy (Completed)   Other fatigue   Relevant Orders   TSH   B12 deficiency due to diet   Relevant Orders   CMP14+EGFR   Vitamin B12   Other constipation   Relevant Medications   polyethylene  glycol powder (GLYCOLAX/MIRALAX) 17 GM/SCOOP powder    No follow-ups on file.   Total time spent: 30 minutes. This time includes review of previous notes and results and patient face to face interaction during today's visit.    FERNAND FREDY RAMAN, MD  12/01/2023   This document may have been prepared by [facility] Voice Recognition software and as such may include unintentional dictation errors.

## 2023-12-02 LAB — LIPID PANEL
Chol/HDL Ratio: 5.1 ratio — ABNORMAL HIGH (ref 0.0–4.4)
Cholesterol, Total: 194 mg/dL (ref 100–199)
HDL: 38 mg/dL — ABNORMAL LOW (ref >39)
LDL Chol Calc (NIH): 142 mg/dL — ABNORMAL HIGH (ref 0–99)
Triglycerides: 74 mg/dL (ref 0–149)
VLDL Cholesterol Cal: 14 mg/dL (ref 5–40)

## 2023-12-02 LAB — HEMOGLOBIN A1C
Est. average glucose Bld gHb Est-mCnc: 117 mg/dL
Hgb A1c MFr Bld: 5.7 % — ABNORMAL HIGH (ref 4.8–5.6)

## 2023-12-02 LAB — CMP14+EGFR
ALT: 15 IU/L (ref 0–32)
AST: 13 IU/L (ref 0–40)
Albumin: 4.2 g/dL (ref 4.0–5.0)
Alkaline Phosphatase: 98 IU/L (ref 41–116)
BUN/Creatinine Ratio: 9 (ref 9–23)
BUN: 8 mg/dL (ref 6–20)
Bilirubin Total: 0.3 mg/dL (ref 0.0–1.2)
CO2: 23 mmol/L (ref 20–29)
Calcium: 9.3 mg/dL (ref 8.7–10.2)
Chloride: 103 mmol/L (ref 96–106)
Creatinine, Ser: 0.85 mg/dL (ref 0.57–1.00)
Globulin, Total: 3.4 g/dL (ref 1.5–4.5)
Glucose: 74 mg/dL (ref 70–99)
Potassium: 4.1 mmol/L (ref 3.5–5.2)
Sodium: 139 mmol/L (ref 134–144)
Total Protein: 7.6 g/dL (ref 6.0–8.5)
eGFR: 99 mL/min/1.73 (ref >59)

## 2023-12-02 LAB — VITAMIN B12: Vitamin B-12: 596 pg/mL (ref 232–1245)

## 2023-12-02 LAB — VITAMIN D 25 HYDROXY (VIT D DEFICIENCY, FRACTURES): Vit D, 25-Hydroxy: 8.2 ng/mL — ABNORMAL LOW (ref 30.0–100.0)

## 2023-12-02 LAB — TSH: TSH: 2.32 u[IU]/mL (ref 0.450–4.500)

## 2023-12-03 LAB — URINE CULTURE

## 2023-12-04 MED ORDER — VITAMIN D3 1.25 MG (50000 UT) PO CAPS: 1.0000 | ORAL_CAPSULE | ORAL | 3 refills | Status: AC

## 2023-12-04 MED ORDER — PRAVASTATIN SODIUM 20 MG PO TABS: 20.0000 mg | ORAL_TABLET | Freq: Every day | ORAL | 1 refills | Status: AC

## 2023-12-07 NOTE — Progress Notes (Signed)
 Patient notified

## 2023-12-11 ENCOUNTER — Ambulatory Visit: Payer: MEDICAID

## 2023-12-13 ENCOUNTER — Other Ambulatory Visit: Payer: Self-pay

## 2023-12-13 ENCOUNTER — Emergency Department
Admission: EM | Admit: 2023-12-13 | Discharge: 2023-12-13 | Disposition: A | Discharge status: Home / Self Care | Payer: MEDICAID | Attending: Emergency Medicine | Admitting: Emergency Medicine

## 2023-12-13 DIAGNOSIS — E119 Type 2 diabetes mellitus without complications: Secondary | ICD-10-CM | POA: Insufficient documentation

## 2023-12-13 DIAGNOSIS — I10 Essential (primary) hypertension: Secondary | ICD-10-CM | POA: Insufficient documentation

## 2023-12-13 DIAGNOSIS — Z79899 Other long term (current) drug therapy: Secondary | ICD-10-CM | POA: Insufficient documentation

## 2023-12-13 DIAGNOSIS — R42 Dizziness and giddiness: Secondary | ICD-10-CM | POA: Insufficient documentation

## 2023-12-13 DIAGNOSIS — J45909 Unspecified asthma, uncomplicated: Secondary | ICD-10-CM | POA: Insufficient documentation

## 2023-12-13 DIAGNOSIS — T7840XA Allergy, unspecified, initial encounter: Secondary | ICD-10-CM | POA: Diagnosis present

## 2023-12-13 LAB — CBC WITH DIFFERENTIAL/PLATELET
Abs Immature Granulocytes: 0.02 K/uL (ref 0.00–0.07)
Basophils Absolute: 0 K/uL (ref 0.0–0.1)
Basophils Relative: 0 %
Eosinophils Absolute: 0.1 K/uL (ref 0.0–0.5)
Eosinophils Relative: 1 %
HCT: 41.1 % (ref 36.0–46.0)
Hemoglobin: 13.1 g/dL (ref 12.0–15.0)
Immature Granulocytes: 0 %
Lymphocytes Relative: 35 %
Lymphs Abs: 3.5 K/uL (ref 0.7–4.0)
MCH: 25.4 pg — ABNORMAL LOW (ref 26.0–34.0)
MCHC: 31.9 g/dL (ref 30.0–36.0)
MCV: 79.7 fL — ABNORMAL LOW (ref 80.0–100.0)
Monocytes Absolute: 0.4 K/uL (ref 0.1–1.0)
Monocytes Relative: 4 %
Neutro Abs: 6 K/uL (ref 1.7–7.7)
Neutrophils Relative %: 60 %
Platelets: 505 K/uL — ABNORMAL HIGH (ref 150–400)
RBC: 5.16 MIL/uL — ABNORMAL HIGH (ref 3.87–5.11)
RDW: 14.4 % (ref 11.5–15.5)
WBC: 10.1 K/uL (ref 4.0–10.5)
nRBC: 0 % (ref 0.0–0.2)

## 2023-12-13 LAB — BASIC METABOLIC PANEL WITH GFR
Anion gap: 10 (ref 5–15)
BUN: 8 mg/dL (ref 6–20)
CO2: 24 mmol/L (ref 22–32)
Calcium: 9 mg/dL (ref 8.9–10.3)
Chloride: 106 mmol/L (ref 98–111)
Creatinine, Ser: 0.76 mg/dL (ref 0.44–1.00)
GFR, Estimated: >60 mL/min (ref >60)
Glucose, Bld: 95 mg/dL (ref 70–99)
Potassium: 3.9 mmol/L (ref 3.5–5.1)
Sodium: 140 mmol/L (ref 135–145)

## 2023-12-13 LAB — HCG, QUANTITATIVE, PREGNANCY: hCG, Beta Chain, Quant, S: <1 m[IU]/mL (ref <5)

## 2023-12-13 MED ORDER — PREDNISONE 10 MG (21) PO TBPK: ORAL_TABLET | ORAL | 0 refills | Status: AC

## 2023-12-13 MED ORDER — FAMOTIDINE IN NACL 20-0.9 MG/50ML-% IV SOLN
20.0000 mg | Freq: Once | INTRAVENOUS | Status: AC
  Administered 2023-12-13: 20 mg via INTRAVENOUS
  Filled 2023-12-13: qty 50

## 2023-12-13 MED ORDER — DIPHENHYDRAMINE HCL 50 MG/ML IJ SOLN
25.0000 mg | Freq: Once | INTRAMUSCULAR | Status: AC
  Administered 2023-12-13: 25 mg via INTRAVENOUS
  Filled 2023-12-13: qty 1

## 2023-12-13 MED ORDER — CETIRIZINE HCL 10 MG PO TABS: 10.0000 mg | ORAL_TABLET | Freq: Every day | ORAL | 2 refills | Status: AC

## 2023-12-13 MED ORDER — METHYLPREDNISOLONE SODIUM SUCC 125 MG IJ SOLR
125.0000 mg | Freq: Once | INTRAMUSCULAR | Status: AC
Start: 2023-12-13 — End: 2023-12-13
  Administered 2023-12-13: 125 mg via INTRAVENOUS
  Filled 2023-12-13: qty 2

## 2023-12-13 MED ORDER — EPINEPHRINE 0.3 MG/0.3ML IJ SOAJ: 0.3000 mg | INTRAMUSCULAR | 1 refills | Status: AC | PRN

## 2023-12-13 MED ORDER — SODIUM CHLORIDE 0.9 % IV BOLUS (SEPSIS)
1000.0000 mL | Freq: Once | INTRAVENOUS | Status: AC
  Administered 2023-12-13: 1000 mL via INTRAVENOUS

## 2023-12-13 MED ORDER — FAMOTIDINE 20 MG PO TABS: 20.0000 mg | ORAL_TABLET | Freq: Two times a day (BID) | ORAL | 0 refills | Status: AC

## 2023-12-13 NOTE — ED Provider Notes (Signed)
 [facility] Provider Note    Event Date/Time   First MD Initiated Contact with Patient 12/13/23 0424     (approximate)   History   Fall and Allergic Reaction   HPI  Tina Day is a 23 y.o. female with history of hypertension, diabetes, hyperlipidemia, asthma, allergies who presents to the emergency department for concern for an allergic reaction.  She states earlier today she had some hives on her torso and left leg and then noticed swelling and itching to her lower lip.  She feels like her throat is itchy, tight and burning.  She denies any new exposures but mother states she did recently start pravastatin  and vitamin D  a week ago.  She reports she has had intermittent episodes where she has urticaria.  Mother reports it has been years since she has seen an allergy specialist and was told previously she was allergic to dust, apricot, coffee.  She denies any other new soaps, lotions, detergents, medications.  No fever.  She states when symptoms started this evening she took 25 mg of oral Benadryl  and went to bed.  She woke up in the middle of the night feeling like her lip was more swollen and uncomfortable and went to her mother's room.  She states she felt lightheaded like she was going to pass out and she fell but states she did not lose consciousness, hit her head.  She denies any injury from her fall.  Denies any preceding chest pain, shortness of breath, palpitations.  No recent fevers, cough, vomiting or diarrhea.  Denies concern for pregnancy.  She does not take any medications for high blood pressure.  She is not on an ACE inhibitor.   History provided by patient, mother, EMS.    Past Medical History:  Diagnosis Date   Asthma    Diabetes (HCC)    Hyperlipidemia    Hypertension    Scoliosis    Self-mutilation age 22-2017 06/30/2020    History reviewed. No pertinent surgical history.  MEDICATIONS:  Prior to Admission medications   Medication  Sig Start Date End Date Taking? Authorizing Provider  Cholecalciferol (VITAMIN D3) 1.25 MG (50000 UT) CAPS Take 1 capsule (1.25 mg total) by mouth once a week. 12/04/23   Fernand Fredy RAMAN, MD  empagliflozin (JARDIANCE) 10 MG TABS tablet Take by mouth daily.    [provider]  losartan  (COZAAR ) 25 MG tablet Take 0.5 tablets (12.5 mg total) by mouth daily. 01/01/23   Orlean Alan HERO, FNP  polyethylene glycol powder (GLYCOLAX /MIRALAX ) 17 GM/SCOOP powder Take 17 g by mouth daily. Dissolve 1 capful (17g) in 4-8 ounces of liquid and take by mouth daily. 12/01/23   Fernand Fredy RAMAN, MD  pravastatin  (PRAVACHOL ) 20 MG tablet Take 1 tablet (20 mg total) by mouth daily. 12/04/23   Fernand Fredy RAMAN, MD  tirzepatide  (MOUNJARO ) 5 MG/0.5ML Pen Inject 5 mg into the skin once a week. 02/06/23   Orlean Alan HERO, FNP    Physical Exam   Triage Vital Signs: ED Triage Vitals  Encounter Vitals Group     BP 12/13/23 0431 103/78     Girls Systolic BP Percentile --      Girls Diastolic BP Percentile --      Boys Systolic BP Percentile --      Boys Diastolic BP Percentile --      Pulse Rate 12/13/23 0431 87     Resp 12/13/23 0431 18     Temp 12/13/23 0431 98.9  F (37.2 C)     Temp Source 12/13/23 0431 Oral     SpO2 12/13/23 0431 100 %     Weight 12/13/23 0428 198 lb (89.8 kg)     Height 12/13/23 0428 5' 1 (1.549 m)     Head Circumference --      Peak Flow --      Pain Score 12/13/23 0427 6     Pain Loc --      Pain Education --      Exclude from Growth Chart --     Most recent vital signs: Vitals:   12/13/23 0431  BP: 103/78  Pulse: 87  Resp: 18  Temp: 98.9 F (37.2 C)  SpO2: 100%    CONSTITUTIONAL: Alert, responds appropriately to questions. Well-appearing; well-nourished HEAD: Normocephalic, atraumatic EYES: Conjunctivae clear, pupils appear equal, sclera nonicteric ENT: normal nose; moist mucous membranes; No pharyngeal erythema or petechiae, no tonsillar hypertrophy or exudate, no  uvular deviation, no unilateral swelling in posterior oropharynx, no trismus or drooling, no muffled voice, normal phonation, no stridor, airway patent.  Mild swelling of the lower lip.  Upper lip appears normal. NECK: Supple, normal ROM CARD: RRR; S1 and S2 appreciated RESP: Normal chest excursion without splinting or tachypnea; breath sounds clear and equal bilaterally; no wheezes, no rhonchi, no rales, no hypoxia or respiratory distress, speaking full sentences ABD/GI: Non-distended; soft, non-tender, no rebound, no guarding, no peritoneal signs BACK: The back appears normal EXT: Normal ROM in all joints; no deformity noted, no edema SKIN: Normal color for age and race; warm; no rash on exposed skin, no urticaria NEURO: Moves all extremities equally, normal speech, ambulates with normal gait PSYCH: The patient's mood and manner are appropriate.   ED Results / Procedures / Treatments   LABS: (all labs ordered are listed, but only abnormal results are displayed) Labs Reviewed  CBC WITH DIFFERENTIAL/PLATELET - Abnormal; Notable for the following components:      Result Value   RBC 5.16 (*)    MCV 79.7 (*)    MCH 25.4 (*)    Platelets 505 (*)    All other components within normal limits  BASIC METABOLIC PANEL WITH GFR  HCG, QUANTITATIVE, PREGNANCY     EKG:  EKG Interpretation Date/Time:  Wednesday December 13 2023 04:53:34 EST Ventricular Rate:  87 PR Interval:  152 QRS Duration:  70 QT Interval:  352 QTC Calculation: 423 R Axis:   50  Text Interpretation: Normal sinus rhythm with sinus arrhythmia Nonspecific T wave abnormality Abnormal ECG When compared with ECG of 26-Aug-2008 13:35, PREVIOUS ECG IS PRESENT Confirmed by Neomi Neptune [phone]) on 12/13/2023 5:00:15 AM         RADIOLOGY: My personal review and interpretation of imaging:    I have personally reviewed all radiology reports.   No results found.   PROCEDURES:  Critical Care performed:  No      Procedures    IMPRESSION / MDM / ASSESSMENT AND PLAN / ED COURSE  I reviewed the triage vital signs and the nursing notes.    Patient here with an allergic reaction.  Then felt dizzy and had a fall but did not hit her head or injure anything in the process.  The patient is on the cardiac monitor to evaluate for evidence of arrhythmia and/or significant heart rate changes.   DIFFERENTIAL DIAGNOSIS (includes but not limited to):   Allergic reaction, no signs of anaphylaxis.  Not on an ACE inhibitor to suggest ACE induced  angioedema.  Differential also includes anemia, electrolyte derangement, dehydration, pregnancy.   Patient's presentation is most consistent with acute presentation with potential threat to life or bodily function.   PLAN: Will give IV Benadryl , steroids, Pepcid  and fluids.  Her airway is patent.  Other than feeling like her throat is burning and having some mild lower lip swelling she is asymptomatic and hemodynamically stable.  She does not need epinephrine  at this time but will monitor.  Given her episode of dizziness we discussed that this could be from the allergic reaction but also could be from the Benadryl  that she took at home and that she got up quickly from bed in the middle of the night.  We will check blood counts and electrolytes to make sure that there is no other organic cause that made her feel dizzy.   MEDICATIONS GIVEN IN ED: Medications  sodium chloride  0.9 % bolus 1,000 mL (0 mLs Intravenous Stopped 12/13/23 0715)  diphenhydrAMINE  (BENADRYL ) injection 25 mg (25 mg Intravenous Given 12/13/23 0531)  methylPREDNISolone  sodium succinate (SOLU-MEDROL ) 125 mg/2 mL injection 125 mg (125 mg Intravenous Given 12/13/23 0529)  famotidine  (PEPCID ) IVPB 20 mg premix (0 mg Intravenous Stopped 12/13/23 0605)     ED COURSE: Normal hemoglobin, electrolytes.  Negative pregnancy test.  Lip swelling has now resolved and mother states that she appears  normal.  Will discharge with Pepcid , prednisone , Zyrtec  and recommended Benadryl  as needed.  Will also prescribe an EpiPen  as she thinks that her previous EpiPen 's have expired.  Recommended close follow-up with an allergy specialist given her mother reports that this is something that she has had intermittently now for months.  I feel she is safe for discharge home.  Patient and mother comfortable with this plan.   At this time, I do not feel there is any life-threatening condition present. I reviewed all nursing notes, vitals, pertinent previous records.  All lab and urine results, EKGs, imaging ordered have been independently reviewed and interpreted by myself.  I reviewed all available radiology reports from any imaging ordered this visit.  Based on my assessment, I feel the patient is safe to be discharged home without further emergent workup and can continue workup as an outpatient as needed. Discussed all findings, treatment plan as well as usual and customary return precautions.  They verbalize understanding and are comfortable with this plan.  Outpatient follow-up has been provided as needed.  All questions have been answered.    CONSULTS:  none   OUTSIDE RECORDS REVIEWED: Reviewed recent internal medicine notes.       FINAL CLINICAL IMPRESSION(S) / ED DIAGNOSES   Final diagnoses:  Allergic reaction, initial encounter  Lightheadedness     Rx / DC Orders   ED Discharge Orders          Ordered    predniSONE  (STERAPRED UNI-PAK 21 TAB) 10 MG (21) TBPK tablet        12/13/23 0645    famotidine  (PEPCID ) 20 MG tablet  2 times daily        12/13/23 0645    cetirizine  (ZYRTEC  ALLERGY) 10 MG tablet  Daily        12/13/23 0704    EPINEPHrine  0.3 mg/0.3 mL IJ SOAJ injection  As needed        12/13/23 9295             Note:  This document was prepared using Dragon voice recognition software and may include unintentional dictation errors.   Che Below, Josette  N, DO 12/14/23  9541

## 2023-12-13 NOTE — ED Triage Notes (Addendum)
 Pt arrived from home via [facility] d/t allergic reaction and fall. Per EMS, pt had bacon last night around 9 pm but is allergic to bacon, and then around 11pm pt experienced bottom lip swelling and tingling. Per EMS, pt took benadryl  around 11:30 pm which provided some relief. Pt reports that she had hives but not at this tim e. Pt reports around 3 am this morning, she woke up and walked towards mom's room but got dizzy and then fell. Pt denies LOC. Pt denies dizziness at this time. Airway is patent at this time upon assessment. Pt denies any changes in voice, difficulty speaking, trouble swallowing.

## 2023-12-13 NOTE — Discharge Instructions (Signed)
 I recommend continuing Benadryl  50 mg every 8 hours as needed.

## 2024-01-24 ENCOUNTER — Ambulatory Visit: Payer: MEDICAID | Admitting: Cardiology

## 2024-01-24 ENCOUNTER — Encounter: Payer: Self-pay | Admitting: Cardiology

## 2024-01-24 VITALS — BP 118/76 | HR 107 | Ht 61.0 in | Wt 204.0 lb

## 2024-01-24 DIAGNOSIS — I152 Hypertension secondary to endocrine disorders: Secondary | ICD-10-CM | POA: Diagnosis not present

## 2024-01-24 DIAGNOSIS — E782 Mixed hyperlipidemia: Secondary | ICD-10-CM

## 2024-01-24 DIAGNOSIS — L509 Urticaria, unspecified: Secondary | ICD-10-CM

## 2024-01-24 DIAGNOSIS — R220 Localized swelling, mass and lump, head: Secondary | ICD-10-CM | POA: Diagnosis not present

## 2024-01-24 DIAGNOSIS — E1159 Type 2 diabetes mellitus with other circulatory complications: Secondary | ICD-10-CM

## 2024-01-24 DIAGNOSIS — E1169 Type 2 diabetes mellitus with other specified complication: Secondary | ICD-10-CM

## 2024-01-24 DIAGNOSIS — E1165 Type 2 diabetes mellitus with hyperglycemia: Secondary | ICD-10-CM | POA: Diagnosis not present

## 2024-01-24 NOTE — Progress Notes (Signed)
 "  Established Patient Office Visit  Subjective:  Patient ID: Tina Day, female    DOB: Oct 07, 2000  Age: 24 y.o. MRN: [id number]  Chief Complaint  Patient presents with   Oral Swelling    Lip swelling    Patient in office for an acute visit, complaining of lip swelling. Started a few weeks ago. Patient also reports having hives previously. Patient requesting a referral to an allergist. Referral sent.  Blood pressure well controlled.     No other concerns at this time.   Past Medical History:  Diagnosis Date   Asthma    Diabetes (HCC)    Hyperlipidemia    Hypertension    Scoliosis    Self-mutilation age 81-2017 06/30/2020    History reviewed. No pertinent surgical history.  Social History   Socioeconomic History   Marital status: Single    Spouse name: Not on file   Number of children: Not on file   Years of education: Not on file   Highest education level: Not on file  Occupational History   Not on file  Tobacco Use   Smoking status: Never   Smokeless tobacco: Never  Vaping Use   Vaping status: Never Used  Substance and Sexual Activity   Alcohol use: Never   Drug use: Never   Sexual activity: Yes    Partners: Male    Birth control/protection: Condom, Implant  Other Topics Concern   Not on file  Social History Narrative   Not on file   Social Drivers of Health   Tobacco Use: Low Risk (01/24/2024)   Patient History    Smoking Tobacco Use: Never    Smokeless Tobacco Use: Never    Passive Exposure: Not on file  Financial Resource Strain: Not on file  Food Insecurity: Not on file  Transportation Needs: Not on file  Physical Activity: Not on file  Stress: Not on file  Social Connections: Not on file  Intimate Partner Violence: Not on file  Depression (PHQ2-9): High Risk (10/25/2022)   Depression (PHQ2-9)    PHQ-2 Score: 12  Alcohol Screen: Not on file  Housing: Not on file  Utilities: Not on file  Health Literacy: Not on file    Family History   Problem Relation Age of Onset   Seizures Paternal Uncle    Diabetes Maternal Grandmother    Stroke Maternal Grandmother    Diabetes Maternal Grandfather    Cancer Paternal Grandmother    Stroke Paternal Grandmother    Cancer Paternal Grandfather    Thyroid disease Mother    Lupus Mother     Allergies[1]  Show/hide medication list[2]  Review of Systems  Constitutional: Negative.   HENT: Negative.    Eyes: Negative.   Respiratory: Negative.  Negative for shortness of breath.   Cardiovascular: Negative.  Negative for chest pain.  Gastrointestinal: Negative.  Negative for abdominal pain, constipation and diarrhea.  Genitourinary: Negative.   Musculoskeletal:  Negative for joint pain and myalgias.  Skin: Negative.   Neurological: Negative.  Negative for dizziness and headaches.  Endo/Heme/Allergies: Negative.   All other systems reviewed and are negative.      Objective:   BP 118/76   Pulse (!) 107   Ht 5' 1 (1.549 m)   Wt 204 lb (92.5 kg)   SpO2 98%   BMI 38.55 kg/m   Vitals:   01/24/24 1025  BP: 118/76  Pulse: (!) 107  Height: 5' 1 (1.549 m)  Weight: 204 lb (92.5 kg)  SpO2: 98%  BMI (Calculated): 38.57    Physical Exam Vitals and nursing note reviewed.  Constitutional:      Appearance: Normal appearance. She is normal weight.  HENT:     Head: Normocephalic and atraumatic.     Nose: Nose normal.     Mouth/Throat:     Mouth: Mucous membranes are moist.  Eyes:     Extraocular Movements: Extraocular movements intact.     Conjunctiva/sclera: Conjunctivae normal.     Pupils: Pupils are equal, round, and reactive to light.  Cardiovascular:     Rate and Rhythm: Normal rate and regular rhythm.     Pulses: Normal pulses.     Heart sounds: Normal heart sounds.  Pulmonary:     Effort: Pulmonary effort is normal.     Breath sounds: Normal breath sounds.  Abdominal:     General: Abdomen is flat. Bowel sounds are normal.     Palpations: Abdomen is soft.   Musculoskeletal:        General: Normal range of motion.     Cervical back: Normal range of motion.  Skin:    General: Skin is warm and dry.  Neurological:     General: No focal deficit present.     Mental Status: She is alert and oriented to person, place, and time.  Psychiatric:        Mood and Affect: Mood normal.        Behavior: Behavior normal.        Thought Content: Thought content normal.        Judgment: Judgment normal.      No results found for any visits on 01/24/24.  Recent Results (from the past 2160 hours)  POCT Urinalysis Dipstick (18997)     Status: Abnormal   Collection Time: 12/01/23  2:41 PM  Result Value Ref Range   Color, UA Orange    Clarity, UA Cloudy    Glucose, UA Negative Negative   Bilirubin, UA Negative    Ketones, UA Negative    Spec Grav, UA >=1.030 (A) 1.010 - 1.025   Blood, UA Negative    pH, UA 5.5 5.0 - 8.0   Protein, UA Positive (A) Negative   Urobilinogen, UA 1.0 0.2 or 1.0 E.U./dL   Nitrite, UA Negative    Leukocytes, UA Negative Negative   Appearance Cloudy    Odor Yes   POCT CBG (Fasting - Glucose)     Status: Abnormal   Collection Time: 12/01/23  3:07 PM  Result Value Ref Range   Glucose Fasting, POC 104 (A) 70 - 99 mg/dL  POCT Urine Pregnancy     Status: None   Collection Time: 12/01/23  3:28 PM  Result Value Ref Range   Preg Test, Ur Negative Negative  Urine Culture     Status: None   Collection Time: 12/01/23  3:49 PM   Specimen: Urine   UR  Result Value Ref Range   Urine Culture, Routine Final report    Organism ID, Bacteria Comment     Comment: Mixed urogenital flora Less than 10,000 colonies/mL   CMP14+EGFR     Status: None   Collection Time: 12/01/23  3:55 PM  Result Value Ref Range   Glucose 74 70 - 99 mg/dL   BUN 8 6 - 20 mg/dL   Creatinine, Ser 9.14 0.57 - 1.00 mg/dL   eGFR 99 >40 fO/fpw/8.26   BUN/Creatinine Ratio 9 9 - 23   Sodium 139 134 - 144 mmol/L  Potassium 4.1 3.5 - 5.2 mmol/L   Chloride  103 96 - 106 mmol/L   CO2 23 20 - 29 mmol/L   Calcium 9.3 8.7 - 10.2 mg/dL   Total Protein 7.6 6.0 - 8.5 g/dL   Albumin 4.2 4.0 - 5.0 g/dL   Globulin, Total 3.4 1.5 - 4.5 g/dL   Bilirubin Total 0.3 0.0 - 1.2 mg/dL   Alkaline Phosphatase 98 41 - 116 IU/L   AST 13 0 - 40 IU/L   ALT 15 0 - 32 IU/L  Lipid panel     Status: Abnormal   Collection Time: 12/01/23  3:55 PM  Result Value Ref Range   Cholesterol, Total 194 100 - 199 mg/dL   Triglycerides 74 0 - 149 mg/dL   HDL 38 (L) >60 mg/dL   VLDL Cholesterol Cal 14 5 - 40 mg/dL   LDL Chol Calc (NIH) 857 (H) 0 - 99 mg/dL   Chol/HDL Ratio 5.1 (H) 0.0 - 4.4 ratio    Comment:                                   T. Chol/HDL Ratio                                             Men  Women                               1/2 Avg.Risk  3.4    3.3                                   Avg.Risk  5.0    4.4                                2X Avg.Risk  9.6    7.1                                3X Avg.Risk 23.4   11.0   VITAMIN D  25 Hydroxy (Vit-D Deficiency, Fractures)     Status: Abnormal   Collection Time: 12/01/23  3:55 PM  Result Value Ref Range   Vit D, 25-Hydroxy 8.2 (L) 30.0 - 100.0 ng/mL    Comment: Vitamin D  deficiency has been defined by the Institute of Medicine and an Endocrine Society practice guideline as a level of serum 25-OH vitamin D  less than 20 ng/mL (1,2). The Endocrine Society went on to further define vitamin D  insufficiency as a level between 21 and 29 ng/mL (2). 1. IOM (Institute of Medicine). 2010. Dietary reference    intakes for calcium and D. [place]: The    [employer]. 2. Holick MF, Binkley [place], Bischoff-Ferrari HA, et al.    Evaluation, treatment, and prevention of vitamin D     deficiency: an Endocrine Society clinical practice    guideline. JCEM. 2011 Jul; 96(7):1911-30.   Vitamin B12     Status: None   Collection Time: 12/01/23  3:55 PM  Result Value Ref Range   Vitamin B-12 596 232 - 1,245 pg/mL  Hemoglobin A1c     Status: Abnormal   Collection Time: 12/01/23  3:55 PM  Result Value Ref Range   Hgb A1c MFr Bld 5.7 (H) 4.8 - 5.6 %    Comment:          Prediabetes: 5.7 - 6.4          Diabetes: >6.4          Glycemic control for adults with diabetes: <7.0    Est. average glucose Bld gHb Est-mCnc 117 mg/dL  TSH     Status: None   Collection Time: 12/01/23  3:55 PM  Result Value Ref Range   TSH 2.320 0.450 - 4.500 uIU/mL  CBC with Differential/Platelet     Status: Abnormal   Collection Time: 12/13/23  5:17 AM  Result Value Ref Range   WBC 10.1 4.0 - 10.5 K/uL   RBC 5.16 (H) 3.87 - 5.11 MIL/uL   Hemoglobin 13.1 12.0 - 15.0 g/dL   HCT 58.8 63.9 - 53.9 %   MCV 79.7 (L) 80.0 - 100.0 fL   MCH 25.4 (L) 26.0 - 34.0 pg   MCHC 31.9 30.0 - 36.0 g/dL   RDW 85.5 88.4 - 84.4 %   Platelets 505 (H) 150 - 400 K/uL   nRBC 0.0 0.0 - 0.2 %   Neutrophils Relative % 60 %   Neutro Abs 6.0 1.7 - 7.7 K/uL   Lymphocytes Relative 35 %   Lymphs Abs 3.5 0.7 - 4.0 K/uL   Monocytes Relative 4 %   Monocytes Absolute 0.4 0.1 - 1.0 K/uL   Eosinophils Relative 1 %   Eosinophils Absolute 0.1 0.0 - 0.5 K/uL   Basophils Relative 0 %   Basophils Absolute 0.0 0.0 - 0.1 K/uL   Immature Granulocytes 0 %   Abs Immature Granulocytes 0.02 0.00 - 0.07 K/uL    Comment: Performed at [facility], [address] [place], [place] [address]  Basic metabolic panel     Status: None   Collection Time: 12/13/23  5:17 AM  Result Value Ref Range   Sodium 140 135 - 145 mmol/L   Potassium 3.9 3.5 - 5.1 mmol/L   Chloride 106 98 - 111 mmol/L   CO2 24 22 - 32 mmol/L   Glucose, Bld 95 70 - 99 mg/dL    Comment: Glucose reference range applies only to samples taken after fasting for at least 8 hours.   BUN 8 6 - 20 mg/dL   Creatinine, Ser 9.23 0.44 - 1.00 mg/dL   Calcium 9.0 8.9 - 89.6 mg/dL   GFR, Estimated >39 >39 mL/min    Comment: (NOTE) Calculated using the CKD-EPI Creatinine Equation (2021)    Anion gap  10 5 - 15    Comment: Performed at [facility], [address] [place], [place] [address]  hCG, quantitative, pregnancy     Status: None   Collection Time: 12/13/23  5:17 AM  Result Value Ref Range   hCG, Beta Chain, Quant, S <1 <5 mIU/mL    Comment:          GEST. AGE      CONC.  (mIU/mL)   <=1 WEEK        5 - 50     2 WEEKS       50 - 500     3 WEEKS       100 - 10,000     4 WEEKS     1,000 - 30,000  5 WEEKS     3,500 - 115,000   6-8 WEEKS     12,000 - 270,000    12 WEEKS     15,000 - 220,000        FEMALE AND NON-PREGNANT FEMALE:     LESS THAN 5 mIU/mL Performed at [facility], [address], [place], [place] [address]       Assessment & Plan:  Referral sent to allergist  Problem List Items Addressed This Visit       Cardiovascular and Mediastinum   Hypertension associated with diabetes (HCC) - Primary     Endocrine   Type 2 diabetes mellitus with hyperglycemia, without long-term current use of insulin (HCC)   Combined hyperlipidemia associated with type 2 diabetes mellitus (HCC)   Other Visit Diagnoses       Hives of unknown origin       Relevant Orders   Ambulatory referral to Allergy     Lip swelling       Relevant Orders   Ambulatory referral to Allergy       Return if symptoms worsen or fail to improve, for as scheduled .   Total time spent: 25 minutes. This time includes review of previous notes and results and patient face to face interaction during today's visit.    Jeoffrey Pollen, NP  01/24/2024   This document may have been prepared by Dragon Voice Recognition software and as such may include unintentional dictation errors.      [1]  Allergies Allergen Reactions   Penicillins Swelling   Porcine (Pork) Protein-Containing Drug Products Hives  [2]  Outpatient Medications Prior to Visit  Medication Sig   empagliflozin (JARDIANCE) 10 MG TABS tablet Take by mouth daily.   EPINEPHrine  0.3 mg/0.3 mL IJ SOAJ injection  Inject 0.3 mg into the muscle as needed for anaphylaxis.   famotidine  (PEPCID ) 20 MG tablet Take 1 tablet (20 mg total) by mouth 2 (two) times daily for 6 days.   losartan  (COZAAR ) 25 MG tablet Take 0.5 tablets (12.5 mg total) by mouth daily.   polyethylene glycol powder (GLYCOLAX /MIRALAX ) 17 GM/SCOOP powder Take 17 g by mouth daily. Dissolve 1 capful (17g) in 4-8 ounces of liquid and take by mouth daily.   predniSONE  (STERAPRED UNI-PAK 21 TAB) 10 MG (21) TBPK tablet Take as directed   tirzepatide  (MOUNJARO ) 5 MG/0.5ML Pen Inject 5 mg into the skin once a week.   cetirizine  (ZYRTEC  ALLERGY) 10 MG tablet Take 1 tablet (10 mg total) by mouth daily. (Patient not taking: Reported on 01/24/2024)   Cholecalciferol (VITAMIN D3) 1.25 MG (50000 UT) CAPS Take 1 capsule (1.25 mg total) by mouth once a week. (Patient not taking: Reported on 01/24/2024)   pravastatin  (PRAVACHOL ) 20 MG tablet Take 1 tablet (20 mg total) by mouth daily. (Patient not taking: Reported on 01/24/2024)   No facility-administered medications prior to visit.   "

## 2024-03-04 ENCOUNTER — Ambulatory Visit: Payer: MEDICAID | Admitting: Family

## 2024-03-04 ENCOUNTER — Ambulatory Visit: Payer: MEDICAID | Admitting: Cardiology
